# Patient Record
Sex: Male | Born: 1950
Health system: Southern US, Community
[De-identification: ages and names within clinical notes are randomized; demographics above are authoritative.]

## PROBLEM LIST (undated history)

## (undated) HISTORY — PX: CATARACT EXTRACTION: SUR2

---

## 2017-01-16 DIAGNOSIS — H02839 Dermatochalasis of unspecified eye, unspecified eyelid: Secondary | ICD-10-CM | POA: Diagnosis not present

## 2017-01-16 DIAGNOSIS — H2511 Age-related nuclear cataract, right eye: Secondary | ICD-10-CM | POA: Diagnosis not present

## 2017-01-16 DIAGNOSIS — H18411 Arcus senilis, right eye: Secondary | ICD-10-CM | POA: Diagnosis not present

## 2017-01-16 DIAGNOSIS — H25011 Cortical age-related cataract, right eye: Secondary | ICD-10-CM | POA: Diagnosis not present

## 2017-01-16 DIAGNOSIS — H401131 Primary open-angle glaucoma, bilateral, mild stage: Secondary | ICD-10-CM | POA: Diagnosis not present

## 2017-01-16 DIAGNOSIS — H2513 Age-related nuclear cataract, bilateral: Secondary | ICD-10-CM | POA: Diagnosis not present

## 2017-02-02 DIAGNOSIS — H2511 Age-related nuclear cataract, right eye: Secondary | ICD-10-CM | POA: Insufficient documentation

## 2017-02-05 DIAGNOSIS — Z79899 Other long term (current) drug therapy: Secondary | ICD-10-CM | POA: Diagnosis not present

## 2017-02-05 DIAGNOSIS — H2511 Age-related nuclear cataract, right eye: Secondary | ICD-10-CM | POA: Diagnosis not present

## 2017-02-05 DIAGNOSIS — F17200 Nicotine dependence, unspecified, uncomplicated: Secondary | ICD-10-CM | POA: Diagnosis not present

## 2017-02-05 DIAGNOSIS — Z7982 Long term (current) use of aspirin: Secondary | ICD-10-CM | POA: Diagnosis not present

## 2017-08-11 ENCOUNTER — Emergency Department (HOSPITAL_BASED_OUTPATIENT_CLINIC_OR_DEPARTMENT_OTHER)
Admission: EM | Admit: 2017-08-11 | Discharge: 2017-08-11 | Disposition: A | Discharge status: Home / Self Care | Payer: Medicare Other | Attending: Emergency Medicine | Admitting: Emergency Medicine

## 2017-08-11 ENCOUNTER — Emergency Department (HOSPITAL_BASED_OUTPATIENT_CLINIC_OR_DEPARTMENT_OTHER): Payer: Medicare Other

## 2017-08-11 ENCOUNTER — Encounter (HOSPITAL_BASED_OUTPATIENT_CLINIC_OR_DEPARTMENT_OTHER): Payer: Self-pay | Admitting: Emergency Medicine

## 2017-08-11 DIAGNOSIS — S42295A Other nondisplaced fracture of upper end of left humerus, initial encounter for closed fracture: Secondary | ICD-10-CM | POA: Diagnosis not present

## 2017-08-11 DIAGNOSIS — F17210 Nicotine dependence, cigarettes, uncomplicated: Secondary | ICD-10-CM | POA: Diagnosis not present

## 2017-08-11 DIAGNOSIS — Y999 Unspecified external cause status: Secondary | ICD-10-CM | POA: Diagnosis not present

## 2017-08-11 DIAGNOSIS — Y9301 Activity, walking, marching and hiking: Secondary | ICD-10-CM | POA: Diagnosis not present

## 2017-08-11 DIAGNOSIS — W01190A Fall on same level from slipping, tripping and stumbling with subsequent striking against furniture, initial encounter: Secondary | ICD-10-CM | POA: Insufficient documentation

## 2017-08-11 DIAGNOSIS — Z23 Encounter for immunization: Secondary | ICD-10-CM | POA: Diagnosis not present

## 2017-08-11 DIAGNOSIS — S50312A Abrasion of left elbow, initial encounter: Secondary | ICD-10-CM | POA: Diagnosis not present

## 2017-08-11 DIAGNOSIS — Y92003 Bedroom of unspecified non-institutional (private) residence as the place of occurrence of the external cause: Secondary | ICD-10-CM | POA: Insufficient documentation

## 2017-08-11 DIAGNOSIS — I10 Essential (primary) hypertension: Secondary | ICD-10-CM | POA: Diagnosis not present

## 2017-08-11 DIAGNOSIS — S59912A Unspecified injury of left forearm, initial encounter: Secondary | ICD-10-CM | POA: Diagnosis present

## 2017-08-11 DIAGNOSIS — S42292A Other displaced fracture of upper end of left humerus, initial encounter for closed fracture: Secondary | ICD-10-CM | POA: Diagnosis not present

## 2017-08-11 DIAGNOSIS — T148XXA Other injury of unspecified body region, initial encounter: Secondary | ICD-10-CM

## 2017-08-11 DIAGNOSIS — F17200 Nicotine dependence, unspecified, uncomplicated: Secondary | ICD-10-CM | POA: Insufficient documentation

## 2017-08-11 MED ORDER — TRAMADOL HCL 50 MG PO TABS: 50.0000 mg | ORAL_TABLET | Freq: Four times a day (QID) | ORAL | 0 refills | Status: AC | PRN

## 2017-08-11 MED ORDER — TETANUS-DIPHTH-ACELL PERTUSSIS 5-2.5-18.5 LF-MCG/0.5 IM SUSP
0.5000 mL | Freq: Once | INTRAMUSCULAR | Status: AC
  Administered 2017-08-11: 0.5 mL via INTRAMUSCULAR
  Filled 2017-08-11: qty 0.5

## 2017-08-11 MED ORDER — MELOXICAM 15 MG PO TABS
15.0000 mg | ORAL_TABLET | Freq: Every day | ORAL | 0 refills | Status: DC
Start: 2017-08-11 — End: 2017-08-29

## 2017-08-11 NOTE — ED Triage Notes (Signed)
Patient reports fall on Friday.  States injury to left arm.  Reports he continues to have pain on this arm.  Denies LOC, any other injuries.

## 2017-08-11 NOTE — Discharge Instructions (Signed)
Contact a health care provider if: °You have any new pain, swelling, or bruising. °Your pain, swelling, and bruising do not improve. °Your cast, splint, or sling becomes loose or damaged. °Get help right away if: °Your skin or fingers on your injured arm turn blue or gray. °Your arm feels cold or numb. °You have severe pain in your injured arm. °

## 2017-08-11 NOTE — ED Notes (Signed)
Patient transported to X-ray 

## 2017-08-11 NOTE — ED Provider Notes (Signed)
[facility]   CSN: [id number] Arrival date & time: 08/11/17  [id number]     History   Chief Complaint No chief complaint on file.   HPI Martin [name] is a 66 y.o. male who presents emergency Department with chief complaint of left arm injury. 2 days ago the patient states that he had a mechanical fall. He states he was not paying attention and hit against an antique chest in his room. His feet became entangled and he fell forward onto his left arm. He did not protect against a fall. He denies hitting his head or losing consciousness. Since that time he has had significant and severe pain in the left upper extremity. He has been unable to use the left shoulder or elbow joint. He is able to move his hand. He has significant bruising along the anterior and medial aspect of the arm and significant swelling all the way down the arm. He denies numbness or tingling. He has no previous injuries to the arm.  HPI  History reviewed. No pertinent past medical history.  There are no active problems to display for this patient.   Past Surgical History:  Procedure Laterality Date  . CATARACT EXTRACTION         Home Medications    Prior to Admission medications   Not on File    Family History History reviewed. No pertinent family history.  Social History Social History  Substance Use Topics  . Smoking status: Current Every Day Smoker    Packs/day: 0.50  . Smokeless tobacco: Never Used  . Alcohol use Yes     Comment: occ     Allergies   Patient has no known allergies.   Review of Systems Review of Systems  Ten systems reviewed and are negative for acute change, except as noted in the HPI.   Physical Exam Updated Vital Signs BP (!) 206/112   Pulse 93   Temp 98 F (36.7 C) (Oral)   Resp 18   Ht 5\' 10"  (1.778 m)   Wt 72.6 kg (160 lb)   SpO2 99%   BMI 22.96 kg/m   Physical Exam  Constitutional: He appears well-developed and well-nourished. No  distress.  HENT:  Head: Normocephalic and atraumatic.  Eyes: Conjunctivae are normal. No scleral icterus.  Neck: Normal range of motion. Neck supple.  Cardiovascular: Normal rate, regular rhythm and normal heart sounds.   Pulmonary/Chest: Effort normal and breath sounds normal. No respiratory distress.  Abdominal: Soft. There is no tenderness.  Musculoskeletal: He exhibits no edema.  Left upper extremity with deep ecchymosis in the antecubital fossa and superiorly through the course of the left bicep muscle. There is some bruising at the inferior and posterior aspect of the left deltoid. Patient is able to use the hand and wrist in its full capacity. He has pain with even minimal flexion of the bicep. He has no pain with passive range of motion of the elbow or shoulder. There is no bony tenderness. There is marked swelling all the way through the wrist. Neurovascularly intact.  Abrasion to the left elbow  Neurological: He is alert.  Skin: Skin is warm and dry. He is not diaphoretic.  Psychiatric: His behavior is normal.  Nursing note and vitals reviewed.    ED Treatments / Results  Labs (all labs ordered are listed, but only abnormal results are displayed) Labs Reviewed - No data to display  EKG  EKG Interpretation None  Radiology No results found.  Procedures Procedures (including critical care time)  Medications Ordered in ED Medications - No data to display   Initial Impression / Assessment and Plan / ED Course  I have reviewed the triage vital signs and the nursing notes.  Pertinent labs & imaging results that were available during my care of the patient were reviewed by me and considered in my medical decision making (see chart for details).     Patient with proximal left humerus fracture. Placed in a sling immobilizer. The humerus is well aligned. Patient is advised to follow-up with orthopedics. Pain medications given. He is neurovascularly intact.  Final  Clinical Impressions(s) / ED Diagnoses   Final diagnoses:  Other closed nondisplaced fracture of proximal end of left humerus, initial encounter  Hypertension, unspecified type  Abrasion    New Prescriptions New Prescriptions   No medications on file     Arthor Captain, PA-C 08/11/17 1728    Long, Arlyss Repress, MD 08/11/17 [phone]

## 2017-08-29 ENCOUNTER — Ambulatory Visit (INDEPENDENT_AMBULATORY_CARE_PROVIDER_SITE_OTHER): Payer: Medicare Other | Admitting: Family Medicine

## 2017-08-29 ENCOUNTER — Encounter: Payer: Self-pay | Admitting: Family Medicine

## 2017-08-29 ENCOUNTER — Ambulatory Visit (HOSPITAL_BASED_OUTPATIENT_CLINIC_OR_DEPARTMENT_OTHER)
Admission: RE | Admit: 2017-08-29 | Discharge: 2017-08-29 | Disposition: A | Discharge status: Home / Self Care | Payer: Medicare Other | Source: Ambulatory Visit | Attending: Family Medicine | Admitting: Family Medicine

## 2017-08-29 DIAGNOSIS — X58XXXA Exposure to other specified factors, initial encounter: Secondary | ICD-10-CM | POA: Diagnosis not present

## 2017-08-29 DIAGNOSIS — S4992XA Unspecified injury of left shoulder and upper arm, initial encounter: Secondary | ICD-10-CM | POA: Diagnosis not present

## 2017-08-29 DIAGNOSIS — S42202A Unspecified fracture of upper end of left humerus, initial encounter for closed fracture: Secondary | ICD-10-CM

## 2017-08-29 MED ORDER — MELOXICAM 15 MG PO TABS: 15.0000 mg | ORAL_TABLET | Freq: Every day | ORAL | 1 refills | Status: AC

## 2017-08-29 NOTE — Patient Instructions (Signed)
You have a proximal humerus fracture. Stop using the sling if possible now - keep this with you the next few days just in case it gets too sore. Tylenol  1-2 tabs three times a day as needed for pain. meloxicam  daily with food for pain and inflammation. Do motion exercises at least twice a day (arm circles, arm swings, wall walking or table slides) 3 sets of 10. Try not to lift more than 1 pound with this arm. Follow up with me in 3 weeks for reevaluation, repeat x-rays. We will consider physical therapy at that visit.

## 2017-09-02 DIAGNOSIS — S42202D Unspecified fracture of upper end of left humerus, subsequent encounter for fracture with routine healing: Secondary | ICD-10-CM | POA: Insufficient documentation

## 2017-09-02 NOTE — Progress Notes (Signed)
PCP: Patient, No Pcp Per  Subjective:   HPI: Patient is a 66 y.o. male here for left arm injury.  Patient reports on 9/1 he tripped and fell directly onto left arm. Immediate pain, difficulty moving about the shoulder on the left. Pain has improved since then and is 0/10 at rest, gets sharp if trying to move the arm though. Using a sling regularly. Is right handed. + swelling and bruising.  No past medical history on file.  Current Outpatient Prescriptions on File Prior to Visit  Medication Sig Dispense Refill  . traMADol (ULTRAM) 50 MG tablet Take 1 tablet (50 mg total) by mouth every 6 (six) hours as needed. 15 tablet 0   No current facility-administered medications on file prior to visit.     Past Surgical History:  Procedure Laterality Date  . CATARACT EXTRACTION      No Known Allergies  Social History   Social History  . Marital status: Single    Spouse name: N/A  . Number of children: N/A  . Years of education: N/A   Occupational History  . Not on file.   Social History Main Topics  . Smoking status: Current Every Day Smoker    Packs/day: 0.50  . Smokeless tobacco: Never Used  . Alcohol use Yes     Comment: occ  . Drug use: No  . Sexual activity: Not on file   Other Topics Concern  . Not on file   Social History Narrative  . No narrative on file    No family history on file.  BP (!) 162/110   Pulse 88   Ht  (1.778 m)   Review of Systems: See HPI above.     Objective:  Physical Exam:  Gen: NAD, comfortable in exam room  Left shoulder: Swelling from shoulder down to hand with mild pitting.  No other deformity.  Associated bruising. Mild TTP proximal upper arm.  No other tenderness of shoulder, arm. FROM wrist, elbow, digits.  Did not test shoulder ROM with known fracture today. Strength 5/5 with elbow flexion, extension, wrist flexion and extension, finger abduction . NV intact distally.   Right shoulder: FROM without  pain  Assessment & Plan:  1. Left proximal humerus fracture, closed.  Ordered and independently reviewed today's radiographs - stable alignment with ? interval healing.  Stop using sling now and focus on motion exercises.  Tylenol and meloxicam if needed.  Restricted to 1 pound at most lifting with this arm.  F/u when 6 weeks out to repeat x-rays and evaluation.  Plan to start PT at that visit as well.

## 2017-09-02 NOTE — Assessment & Plan Note (Signed)
Ordered and independently reviewed today's radiographs - stable alignment with ? interval healing.  Stop using sling now and focus on motion exercises.  Tylenol and meloxicam if needed.  Restricted to 1 pound at most lifting with this arm.  F/u when 6 weeks out to repeat x-rays and evaluation.  Plan to start PT at that visit as well.

## 2017-09-19 ENCOUNTER — Ambulatory Visit (HOSPITAL_BASED_OUTPATIENT_CLINIC_OR_DEPARTMENT_OTHER)
Admission: RE | Admit: 2017-09-19 | Discharge: 2017-09-19 | Disposition: A | Discharge status: Home / Self Care | Payer: Medicare Other | Source: Ambulatory Visit | Attending: Family Medicine | Admitting: Family Medicine

## 2017-09-19 ENCOUNTER — Encounter: Payer: Self-pay | Admitting: Family Medicine

## 2017-09-19 ENCOUNTER — Ambulatory Visit (INDEPENDENT_AMBULATORY_CARE_PROVIDER_SITE_OTHER): Payer: Medicare Other | Admitting: Family Medicine

## 2017-09-19 VITALS — BP 180/90 | Ht 70.0 in | Wt 156.0 lb

## 2017-09-19 DIAGNOSIS — S42202A Unspecified fracture of upper end of left humerus, initial encounter for closed fracture: Secondary | ICD-10-CM

## 2017-09-19 DIAGNOSIS — S42212A Unspecified displaced fracture of surgical neck of left humerus, initial encounter for closed fracture: Secondary | ICD-10-CM | POA: Diagnosis not present

## 2017-09-19 DIAGNOSIS — X58XXXA Exposure to other specified factors, initial encounter: Secondary | ICD-10-CM | POA: Diagnosis not present

## 2017-09-19 DIAGNOSIS — S42202D Unspecified fracture of upper end of left humerus, subsequent encounter for fracture with routine healing: Secondary | ICD-10-CM | POA: Diagnosis not present

## 2017-09-19 DIAGNOSIS — S42292A Other displaced fracture of upper end of left humerus, initial encounter for closed fracture: Secondary | ICD-10-CM | POA: Insufficient documentation

## 2017-09-19 NOTE — Patient Instructions (Signed)
You're doing well. Start physical therapy and do home exercises on days you don't go to therapy. Take tylenol, tramadol, and/or meloxicam if needed - you'll likely need to take something before you go to therapy. Follow up with me in about 1 month to 6 weeks for reevaluation.

## 2017-09-20 NOTE — Assessment & Plan Note (Signed)
Independently reviewed repeat radiographs - no change in alignment.  Some interval healing.  Clinically much improved however.  Will start physical therapy to regain motion and advance to strengthening.  Tylenol, tramadol, meloxicam if needed.  F/u in 1 month to 6 weeks.

## 2017-09-20 NOTE — Progress Notes (Signed)
PCP: Patient, No Pcp Per  Subjective:   HPI: Patient is a 66 y.o. male here for left arm injury.  9/21: Patient reports on 9/1 he tripped and fell directly onto left arm. Immediate pain, difficulty moving about the shoulder on the left. Pain has improved since then and is 0/10 at rest, gets sharp if trying to move the arm though. Using a sling regularly. Is right handed. + swelling and bruising.  10/12: Patient reports he's doing well. Rarely if turning the wrong way will feel a 7/10 sharp pain left shoulder but it doesn't linger. Not taking any medicines. No night pain. Doing some home exercises. Still with some bruising laterally.  No other skin changes. No numbness.  No past medical history on file.  Current Outpatient Prescriptions on File Prior to Visit  Medication Sig Dispense Refill  . latanoprost (XALATAN) 0.005 % ophthalmic solution     . meloxicam (MOBIC) 15 MG tablet Take 1 tablet (15 mg total) by mouth daily. 30 tablet 1  . traMADol (ULTRAM) 50 MG tablet Take 1 tablet (50 mg total) by mouth every 6 (six) hours as needed. 15 tablet 0   No current facility-administered medications on file prior to visit.     Past Surgical History:  Procedure Laterality Date  . CATARACT EXTRACTION      No Known Allergies  Social History   Social History  . Marital status: Single    Spouse name: N/A  . Number of children: N/A  . Years of education: N/A   Occupational History  . Not on file.   Social History Main Topics  . Smoking status: Current Every Day Smoker    Packs/day: 0.50  . Smokeless tobacco: Never Used  . Alcohol use Yes     Comment: occ  . Drug use: No  . Sexual activity: Not on file   Other Topics Concern  . Not on file   Social History Narrative  . No narrative on file    No family history on file.  BP (!) 180/90   Ht  (1.778 m)   Wt 156 lb (70.8 kg)   BMI 22.38 kg/m   Review of Systems: See HPI above.     Objective:   Physical Exam:  Gen: NAD, comfortable in exam room.  Left shoulder: Small bruise lateral shoulder.  Mild swelling into hand.  No other deformity. No TTP. Abduction and flexion to 90 degrees, abduction to 50 degrees. Negative Hawkins, Neers. Negative Yergasons. Strength 5-/5 with empty can and resisted internal/external rotation. NV intact distally.  Right shoulder: No swelling, ecchymoses.  No gross deformity. No TTP. FROM. Strength 5/5 with empty can and resisted internal/external rotation. NV intact distally.  Assessment & Plan:  1. Left proximal humerus fracture, closed.  Independently reviewed repeat radiographs - no change in alignment.  Some interval healing.  Clinically much improved however.  Will start physical therapy to regain motion and advance to strengthening.  Tylenol, tramadol, meloxicam if needed.  F/u in 1 month to 6 weeks.

## 2017-09-24 ENCOUNTER — Ambulatory Visit: Payer: Medicare Other | Attending: Family Medicine | Admitting: Physical Therapy

## 2017-09-24 DIAGNOSIS — R293 Abnormal posture: Secondary | ICD-10-CM

## 2017-09-24 DIAGNOSIS — M25612 Stiffness of left shoulder, not elsewhere classified: Secondary | ICD-10-CM | POA: Diagnosis not present

## 2017-09-24 DIAGNOSIS — M25512 Pain in left shoulder: Secondary | ICD-10-CM | POA: Insufficient documentation

## 2017-09-24 DIAGNOSIS — R29898 Other symptoms and signs involving the musculoskeletal system: Secondary | ICD-10-CM | POA: Insufficient documentation

## 2017-09-24 NOTE — Patient Instructions (Signed)
Cane Exercise: Flexion   Lie on back, holding cane above chest. Keeping arms as straight as possible, lower cane toward floor beyond head. Hold __5-10__ seconds. Repeat __10-15__ times. Do __2__ sessions per day.  Cane Exercise: Abduction   Hold cane with right hand over end, palm-up, with other hand palm-down. Move arm out from side and up by pushing with other arm. Hold __5-10__ seconds. Repeat _10-15___ times. Do __2__ sessions per day.  SELF ASSISTED WITH OBJECT: Shoulder External Rotation - Supine   Hold cane with both hands, keep elbow bent and next to body. Rotate arm away from body. _10-15__ reps per set, _2__ sets per day.  Scapular Retraction (Standing)   With arms at sides, pinch shoulder blades together. Repeat __15__ times per set. Do _2___ sessions per day.

## 2017-09-25 NOTE — Therapy (Signed)
[facility] [address]  [address] [place], [place], [address] Phone: [phone]   Fax:  [phone]  Physical Therapy Evaluation  Patient Details  Name: Martin [name] MRN: [id number] Date of Birth: 1951-09-06 Referring Provider: Dr. Norton Blizzard  Encounter Date: 09/24/2017      PT End of Session - 09/25/17 0806    Visit Number 1   Number of Visits 12   Date for PT Re-Evaluation 11/06/17   Authorization Type Medicare   PT Start Time 1403   PT Stop Time 1443   PT Time Calculation (min) 40 min   Activity Tolerance Patient tolerated treatment well   Behavior During Therapy [facility] for tasks assessed/performed      No past medical history on file.  Past Surgical History:  Procedure Laterality Date  . CATARACT EXTRACTION      There were no vitals filed for this visit.       Subjective Assessment - 09/24/17 1404    Subjective Patient reporting he was walking and tripped and hit arm - fractured humerus. Wore a sling for approx 2-3 weeks. Saw sports MD - discontinued sling. Patient not reporting most difficulty with reduced motion as well as swelling limiting making a fist. No pain unless making a "weird" motion.    Diagnostic tests xray - normal healing   Patient Stated Goals improve function of shoulder/arm   Currently in Pain? No/denies   Pain Score 0-No pain            [facility] PT Assessment - 09/24/17 1409      Assessment   Medical Diagnosis Closed fracture of proximal end of L humerus   Referring Provider Dr. Norton Blizzard   Onset Date/Surgical Date 08/11/17   Hand Dominance Right   Next MD Visit --  ~4 weeks   Prior Therapy no     Precautions   Precautions None     Restrictions   Weight Bearing Restrictions No     Balance Screen   Has the patient fallen in the past 6 months Yes   How many times? 1   Has the patient had a decrease in activity level because of a fear of falling?  No   Is the  patient reluctant to leave their home because of a fear of falling?  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   Leisure yard work, being outside     [employer]   Overall Cognitive Status Within Functional Limits for tasks assessed     Observation/Other Assessments   Focus on Therapeutic Outcomes (FOTO)  Shoulder: 54 (46% limited, predicted 29% limited)     Sensation   Light Touch Appears Intact     Coordination   Gross Motor Movements are Fluid and Coordinated No  due to weakness and loss of ROM     Posture/Postural Control   Posture/Postural Control Postural limitations   Postural Limitations Rounded Shoulders;Forward head     ROM / Strength   AROM / PROM / Strength AROM;PROM;Strength     AROM   AROM Assessment Site Shoulder   Right/Left Shoulder Left   Left Shoulder Flexion 101 Degrees   Left Shoulder ABduction 86 Degrees   Left Shoulder Internal Rotation --  FIR to approx L5   Left Shoulder External Rotation --  FER to approx occiput     PROM   PROM Assessment Site Shoulder   Right/Left Shoulder Left   Left Shoulder Flexion 121 Degrees   Left Shoulder ABduction 104 Degrees   Left Shoulder Internal Rotation 66 Degrees   Left Shoulder External Rotation 54 Degrees     Strength   Overall Strength Comments R UE grossly 4+/5; L UE grossly 3/5 within available motion     Palpation   Palpation comment diffusely non-tender            Objective measurements completed on examination: See above findings.          [facility]/Exercise - 09/24/17 1409      Exercises   Exercises Shoulder     Shoulder Exercises: Supine   External Rotation AAROM;Left;10 reps   Flexion AAROM;Left;10 reps   ABduction AAROM;Left;10 reps     Shoulder Exercises: Seated   Other Seated Exercises scap retraction 10 x 10  sec holds                PT Education - 09/25/17 0806    Education provided Yes   Education Details exam findings, POC, HEP   Person(s) Educated Patient   Methods Explanation;Demonstration;Handout   Comprehension Verbalized understanding;Returned demonstration          PT Short Term Goals - 09/25/17 0829      PT SHORT TERM GOAL #1   Title patient to be independent with initial HEP   Status New   Target Date 10/16/17     PT SHORT TERM GOAL #2   Title Patient to improve PROM of L shoulder flexion and abduction to >/= 150 degrees with pain no greater than 2/10   Status New   Target Date 10/16/17           PT Long Term Goals - 09/25/17 0830      PT LONG TERM GOAL #1   Title patient to be independent with advanced HEP   Status New   Target Date 11/06/17     PT LONG TERM GOAL #2   Title patient to improve L shoulder AROM: flexion and abduction to >/= 150 degrees    Status New   Target Date 11/06/17     PT LONG TERM GOAL #3   Title patient to improve L shoulder strength to >/= 4/5   Status New   Target Date 11/06/17     PT LONG TERM GOAL #4   Title patient to report ability to perform ADLs and light household tasks without limitation by pain or ROM   Status New   Target Date 11/06/17                Plan - 09/25/17 0807    Clinical Impression Statement Mr. Susanne GreenhouseBriles is a 66 y/o male presenting to OPPT today s/p L humerus fracture resulting from a fall. Patient today with limited AROM and PROM at L shoulder with pain at end ranges of all motion. PT speaking with MD regarding patient restrictions with patient currenty not to lift greater than 5# with L UE and 15# with B UE. Patient given initial HEP for AAROM exercises to promote increased ROM as well as gentle periscpaular strengthening. Patient to benefit from PT to address deficits listed above to allow for improved functional use of L UE.    Clinical Presentation Stable   Clinical Decision Making Low    Rehab Potential Good   PT Frequency 2x /  week   PT Duration 6 weeks   PT Treatment/Interventions ADLs/Self Care Home Management;Cryotherapy;Electrical Stimulation;Iontophoresis 4mg /ml Dexamethasone;Moist Heat;Ultrasound;Therapeutic exercise;Therapeutic activities;Patient/family education;Manual techniques;Passive range of motion;Vasopneumatic Device;Taping;Dry needling   Consulted and Agree with Plan of Care Patient      Patient will benefit from skilled therapeutic intervention in order to improve the following deficits and impairments:  Impaired UE functional use, Pain, Decreased strength, Decreased range of motion  Visit Diagnosis: Acute pain of left shoulder - Plan: PT plan of care cert/re-cert  Stiffness of left shoulder, not elsewhere classified - Plan: PT plan of care cert/re-cert  Other symptoms and signs involving the musculoskeletal system - Plan: PT plan of care cert/re-cert  Abnormal posture - Plan: PT plan of care cert/re-cert      G-Codes - 2017/10/17 0843    Functional Assessment Tool Used (Outpatient Only) FOTO: 54 (46% limited)   Functional Limitation Carrying, moving and handling objects   Carrying, Moving and Handling Objects Current Status (Z6109) At least 40 percent but less than 60 percent impaired, limited or restricted   Carrying, Moving and Handling Objects Goal Status (U0454) At least 20 percent but less than 40 percent impaired, limited or restricted       Problem List Patient Active Problem List   Diagnosis Date Noted  . Closed fracture of proximal end of left humerus with routine healing, subsequent encounter 09/02/2017  . Nuclear sclerotic cataract of right eye 02/02/2017    Kipp Laurence, PT, DPT 10-17-17 8:46 AM   [facility] [address]  [address] [place], [place], [address] Phone: [phone]   Fax:  [phone]  Name: Koltyn Kelsay MRN: [id number] Date of Birth:  1951/05/22

## 2017-09-30 ENCOUNTER — Ambulatory Visit: Payer: Medicare Other | Admitting: Physical Therapy

## 2017-10-02 ENCOUNTER — Ambulatory Visit: Payer: Medicare Other | Admitting: Physical Therapy

## 2017-10-02 DIAGNOSIS — R293 Abnormal posture: Secondary | ICD-10-CM

## 2017-10-02 DIAGNOSIS — M25512 Pain in left shoulder: Secondary | ICD-10-CM

## 2017-10-02 DIAGNOSIS — R29898 Other symptoms and signs involving the musculoskeletal system: Secondary | ICD-10-CM | POA: Diagnosis not present

## 2017-10-02 DIAGNOSIS — M25612 Stiffness of left shoulder, not elsewhere classified: Secondary | ICD-10-CM | POA: Diagnosis not present

## 2017-10-02 NOTE — Therapy (Signed)
[facility] [address]  [address] [place], [place], [address] Phone: [phone]   Fax:  [phone]  Physical Therapy Treatment  Patient Details  Name: Martin NapWilliam Pellecchia [name] MRN: [id number] Date of Birth: Aug 09, 1951 Referring Provider: Dr. Norton BlizzardShane Hudnall  Encounter Date: 10/02/2017      PT End of Session - 10/02/17 1546    Visit Number 2   Number of Visits 12   Date for PT Re-Evaluation 11/06/17   Authorization Type Medicare   PT Start Time 1531   PT Stop Time 1619   PT Time Calculation (min) 48 min   Activity Tolerance Patient tolerated treatment well   Behavior During Therapy [facility] for tasks assessed/performed      No past medical history on file.  Past Surgical History:  Procedure Laterality Date  . CATARACT EXTRACTION      There were no vitals filed for this visit.      Subjective Assessment - 10/02/17 1546    Subjective has been doing HEP; noticing some improvements   Diagnostic tests xray - normal healing   Patient Stated Goals improve function of shoulder/arm   Currently in Pain? No/denies   Pain Score --  8/10 with PROM                         [facility] Adult PT Treatment/Exercise - 10/02/17 0001      Shoulder Exercises: Seated   Flexion AAROM;Left;10 reps   Flexion Limitations use of mirror to reduce compensation   Abduction AAROM;Left;10 reps   ABduction Limitations use of mirror to reduce compensation     Shoulder Exercises: Standing   Other Standing Exercises counter walkouts for flexion ROM x 5 reps     Shoulder Exercises: Pulleys   Flexion 3 minutes   ABduction 3 minutes   ABduction Limitations scaption     Shoulder Exercises: Isometric Strengthening   Flexion Limitations 10 x 5 sec; 50% effort, L UE   External Rotation Limitations 10 x 5 sec; 50% effort, L UE   Internal Rotation Limitations 10 x 5 sec; 50% effort, L UE   ABduction Limitations 10 x 5 sec; 50% effort, L UE    ADduction Limitations 10 x 5 sec; 50% effort, L UE     Manual Therapy   Manual Therapy Passive ROM   Manual therapy comments patient supine with bolster   Passive ROM PROM at L [facility] into all planes; pain at end ranges of all motion limiting continued motion; ER/IR/abduction performed in slight scaption                  PT Short Term Goals - 10/02/17 1547      PT SHORT TERM GOAL #1   Title patient to be independent with initial HEP   Status On-going     PT SHORT TERM GOAL #2   Title Patient to improve PROM of L shoulder flexion and abduction to >/= 150 degrees with pain no greater than 2/10   Status On-going           PT Long Term Goals - 10/02/17 1812      PT LONG TERM GOAL #1   Title patient to be independent with advanced HEP   Status On-going     PT LONG TERM GOAL #2   Title patient to improve L shoulder AROM: flexion and abduction to >/= 150 degrees    Status On-going  PT LONG TERM GOAL #3   Title patient to improve L shoulder strength to >/= 4/5   Status On-going     PT LONG TERM GOAL #4   Title patient to report ability to perform ADLs and light household tasks without limitation by pain or ROM   Status On-going               Plan - 10/02/17 1813    Clinical Impression Statement Patient doing well today - reports taking pain medication prior to visit. GOod compliance with HEP thus far. Paitent tolerating all PROM at L [facility] joint into further ranges, however pain at end ranges of all motion limiting, rather than end-feel. Patient initiating isometrics today with good tolerance and muscle activation.    PT Treatment/Interventions ADLs/Self Care Home Management;Cryotherapy;Electrical Stimulation;Iontophoresis 4mg /ml Dexamethasone;Moist Heat;Ultrasound;Therapeutic exercise;Therapeutic activities;Patient/family education;Manual techniques;Passive range of motion;Vasopneumatic Device;Taping;Dry needling   Consulted and Agree with Plan of Care  Patient      Patient will benefit from skilled therapeutic intervention in order to improve the following deficits and impairments:  Impaired UE functional use, Pain, Decreased strength, Decreased range of motion  Visit Diagnosis: Acute pain of left shoulder  Stiffness of left shoulder, not elsewhere classified  Other symptoms and signs involving the musculoskeletal system  Abnormal posture     Problem List Patient Active Problem List   Diagnosis Date Noted  . Closed fracture of proximal end of left humerus with routine healing, subsequent encounter 09/02/2017  . Nuclear sclerotic cataract of right eye 02/02/2017     Kipp Laurence, PT, DPT 10/02/17 6:15 PM   [facility] [address]  [address] [place], [place], [address] Phone: [phone]   Fax:  [phone]  Name: Martin [name] MRN: [id number] Date of Birth: [birth year]

## 2017-10-02 NOTE — Patient Instructions (Signed)
Flexion (Isometric)    Press right fist against wall. Hold __5-10__ seconds. Repeat _10-15___ times.    Internal Rotation (Isometric)    Place palm of right fist against door frame, with elbow bent. Press fist against door frame. Hold __5-10__ seconds. Repeat __10-15__ times.   External Rotation (Isometric)    Place back of left fist against door frame, with elbow bent. Press fist against door frame. Hold _5-10___ seconds. Repeat __10-15__ times.   Strengthening: Isometric Adduction    Using body for resistance, gently press right arm into ball using light pressure. Hold _5-10___ seconds. Repeat _10-15___ times per set.   Strengthening: Isometric Abduction    Using wall for resistance, press left arm into ball using light pressure. Hold __5-10__ seconds. Repeat _10-15___ times per set.

## 2017-10-07 ENCOUNTER — Ambulatory Visit: Payer: Medicare Other

## 2017-10-07 DIAGNOSIS — R293 Abnormal posture: Secondary | ICD-10-CM

## 2017-10-07 DIAGNOSIS — M25512 Pain in left shoulder: Secondary | ICD-10-CM

## 2017-10-07 DIAGNOSIS — M25612 Stiffness of left shoulder, not elsewhere classified: Secondary | ICD-10-CM | POA: Diagnosis not present

## 2017-10-07 DIAGNOSIS — R29898 Other symptoms and signs involving the musculoskeletal system: Secondary | ICD-10-CM | POA: Diagnosis not present

## 2017-10-07 NOTE — Therapy (Signed)
[facility] [address]  [address] [place], [place], [address] Phone: [phone]   Fax:  [phone]  Physical Therapy Treatment  Patient Details  Name: Martin [name] MRN: [id number] Date of Birth: 1951-01-14 Referring Provider: Dr. Norton Blizzard  Encounter Date: 10/07/2017      PT End of Session - 10/07/17 1319    Visit Number 3   Number of Visits 12   Date for PT Re-Evaluation 11/06/17   Authorization Type Medicare   PT Start Time 1312   PT Stop Time 1403   PT Time Calculation (min) 51 min   Activity Tolerance Patient tolerated treatment well   Behavior During Therapy [facility] for tasks assessed/performed      No past medical history on file.  Past Surgical History:  Procedure Laterality Date  . CATARACT EXTRACTION      There were no vitals filed for this visit.      Subjective Assessment - 10/07/17 1315    Subjective Pt. reporting latest HEP going well.     Patient Stated Goals improve function of shoulder/arm   Currently in Pain? No/denies   Pain Score 0-No pain   Multiple Pain Sites No                         [facility] Adult PT Treatment/Exercise - 10/07/17 1338      Self-Care   Self-Care Other Self-Care Comments   Other Self-Care Comments  Self-massage with red med ball (1000Gr) on wall to posterior/inferior shoulder in L posterior shoulder stretch position x 1 min      Shoulder Exercises: Seated   Flexion AAROM;Left;10 reps   Flexion Limitations use of mirror to reduce compensation   Abduction AAROM;Left;10 reps  scaption   ABduction Limitations use of mirror to reduce compensation   Other Seated Exercises flexion and scaption red P-ball roll outs sitting on mat table x 10 reps each way   Other Seated Exercises L shoulder scaption slide on counter (pt. sitting in chair) x 10 reps   hand on wash cloth     Shoulder Exercises: Sidelying   External Rotation Left;10 reps;AROM   External Rotation Limitations towel under elbow      Shoulder Exercises: Pulleys   Flexion 3 minutes   ABduction 3 minutes   ABduction Limitations scaption     Shoulder Exercises: Stretch   Other Shoulder Stretches L posterior shoulder stretch x 30 sec      Manual Therapy   Manual Therapy Passive ROM;Soft tissue mobilization;Muscle Energy Technique   Manual therapy comments patient supine with bolster   Soft tissue mobilization STM/DTM to posterior/inferior shoulder in 90 dg scaption position    Passive ROM PROM at L [address] joint into all planes; pain at end ranges of all motion limiting continued motion; ER/IR/abduction performed in slight scaption   Muscle Energy Technique L shoulder contract/relax stretch into scaption and flexion motions; good tolerance for this with reduced guarding                   PT Short Term Goals - 10/02/17 1547      PT SHORT TERM GOAL #1   Title patient to be independent with initial HEP   Status On-going     PT SHORT TERM GOAL #2   Title Patient to improve PROM of L shoulder flexion and abduction to >/= 150 degrees with pain no greater than 2/10   Status  On-going           PT Long Term Goals - 10/02/17 1812      PT LONG TERM GOAL #1   Title patient to be independent with advanced HEP   Status On-going     PT LONG TERM GOAL #2   Title patient to improve L shoulder AROM: flexion and abduction to >/= 150 degrees    Status On-going     PT LONG TERM GOAL #3   Title patient to improve L shoulder strength to >/= 4/5   Status On-going     PT LONG TERM GOAL #4   Title patient to report ability to perform ADLs and light household tasks without limitation by pain or ROM   Status On-going               Plan - 10/07/17 1330    Clinical Impression Statement Martin [name] reporting latest HEP going well.  Felt good following last visit.  Good tolerance today for shoulder PROM with prolonged holds.  Much reduced muscular guarding with  contract/relax into flexion and scaption movements today.  Some palpable tightness in posterior/inferior shoulder with flexion and scaption motions thus some STM/DTM to this area with good tolerance.  Self-ball massage to this area initiated today with pt. tolerating well.  Will continue to progress per pt. in coming visits.   PT Treatment/Interventions ADLs/Self Care Home Management;Cryotherapy;Electrical Stimulation;Iontophoresis 4mg /ml Dexamethasone;Moist Heat;Ultrasound;Therapeutic exercise;Therapeutic activities;Patient/family education;Manual techniques;Passive range of motion;Vasopneumatic Device;Taping;Dry needling      Patient will benefit from skilled therapeutic intervention in order to improve the following deficits and impairments:  Impaired UE functional use, Pain, Decreased strength, Decreased range of motion  Visit Diagnosis: Acute pain of left shoulder  Stiffness of left shoulder, not elsewhere classified  Other symptoms and signs involving the musculoskeletal system  Abnormal posture     Problem List Patient Active Problem List   Diagnosis Date Noted  . Closed fracture of proximal end of left humerus with routine healing, subsequent encounter 09/02/2017  . Nuclear sclerotic cataract of right eye 02/02/2017    Kermit BaloMicah Raeya Merritts, PTA 10/07/17 2:25 PM  [facility] [place] [address]  [address] [place], [place], [address] Phone: [phone]   Fax:  [phone]  Name: Martin [name] MRN: [id number] Date of Birth: 04/15/1951

## 2017-10-09 ENCOUNTER — Ambulatory Visit: Payer: Medicare Other | Attending: Family Medicine | Admitting: Physical Therapy

## 2017-10-09 DIAGNOSIS — R293 Abnormal posture: Secondary | ICD-10-CM | POA: Insufficient documentation

## 2017-10-09 DIAGNOSIS — M25612 Stiffness of left shoulder, not elsewhere classified: Secondary | ICD-10-CM | POA: Insufficient documentation

## 2017-10-09 DIAGNOSIS — R29898 Other symptoms and signs involving the musculoskeletal system: Secondary | ICD-10-CM | POA: Diagnosis not present

## 2017-10-09 DIAGNOSIS — M25512 Pain in left shoulder: Secondary | ICD-10-CM | POA: Insufficient documentation

## 2017-10-09 NOTE — Patient Instructions (Signed)
External / Internal Rotator Cuff Stretch, Standing    Stand and reach one arm over head, other arm behind back. Clasp hands, if possible. Use belt or small towel if hands cannot clasp. Hold _30__ seconds. Repeat _3__ times per session.

## 2017-10-09 NOTE — Therapy (Signed)
[facility] [address]  [address] [place], [place], [address] Phone: [phone]   Fax:  [phone]  Physical Therapy Treatment  Patient Details  Name: Martin [name] MRN: [id number] Date of Birth: [birth year] Referring Provider: Dr. Norton BlizzardShane Hudnall  Encounter Date: 10/09/2017      PT End of Session - 10/09/17 1501    Visit Number 4   Number of Visits 12   Date for PT Re-Evaluation 11/06/17   Authorization Type Medicare   PT Start Time 1400   PT Stop Time 1448   PT Time Calculation (min) 48 min   Activity Tolerance Patient tolerated treatment well   Behavior During Therapy [facility] for tasks assessed/performed      No past medical history on file.  Past Surgical History:  Procedure Laterality Date  . CATARACT EXTRACTION      There were no vitals filed for this visit.      Subjective Assessment - 10/09/17 1359    Subjective Patient doing well today. Reporting no pain and feels as though he is gaining more and more motion every day.    Currently in Pain? No/denies   Pain Score 0-No pain                         [facility] Adult PT Treatment/Exercise - 10/09/17 1401      Shoulder Exercises: Seated   Other Seated Exercises 3 way red p-ball roll outs; 10 reps each way     Shoulder Exercises: Sidelying   External Rotation Left;10 reps;AROM   External Rotation Limitations towel under elbow    Internal Rotation AROM;Left;10 reps   Theraband Level (Shoulder Internal Rotation) Level 1 (Yellow)   Internal Rotation Limitations towel under elbow     Shoulder Exercises: Standing   Flexion AROM;Left;10 reps   Flexion Limitations touch shelf 1 and 2 in cabinet   ABduction AROM;10 reps;Left   ABduction Limitations touch shelf 1 and 2 in cabinet   Other Standing Exercises push up plus at counter; 15 reps     Shoulder Exercises: Pulleys   Flexion 3 minutes   ABduction 3 minutes   ABduction Limitations  scaption     Manual Therapy   Manual Therapy Passive ROM   Manual therapy comments patient supine with bolster   Passive ROM PROM at L [facility] into all planes; pain at end ranges of all motion limiting continued motion; ER/IR/abduction performed in slight scaption                  PT Short Term Goals - 10/02/17 1547      PT SHORT TERM GOAL #1   Title patient to be independent with initial HEP   Status On-going     PT SHORT TERM GOAL #2   Title Patient to improve PROM of L shoulder flexion and abduction to >/= 150 degrees with pain no greater than 2/10   Status On-going           PT Long Term Goals - 10/02/17 1812      PT LONG TERM GOAL #1   Title patient to be independent with advanced HEP   Status On-going     PT LONG TERM GOAL #2   Title patient to improve L shoulder AROM: flexion and abduction to >/= 150 degrees    Status On-going     PT LONG TERM GOAL #3   Title patient to improve  L shoulder strength to >/= 4/5   Status On-going     PT LONG TERM GOAL #4   Title patient to report ability to perform ADLs and light household tasks without limitation by pain or ROM   Status On-going               Plan - 10/09/17 1503    Clinical Impression Statement Patient reports doing well with all HEP, little to no complaints of pain since his last visit. Patient progressing will with PROM and prolonged stretching and able to tolerate slightly more pressure in all planes. Focused today's session on more AROM in all planes with minimal cueing for patient to maintain proper posture and avoid muscle guarding for all motions at LUE.    PT Treatment/Interventions ADLs/Self Care Home Management;Cryotherapy;Electrical Stimulation;Iontophoresis 4mg /ml Dexamethasone;Moist Heat;Ultrasound;Therapeutic exercise;Therapeutic activities;Patient/family education;Manual techniques;Passive range of motion;Vasopneumatic Device;Taping;Dry needling      Patient will benefit from  skilled therapeutic intervention in order to improve the following deficits and impairments:  Impaired UE functional use, Pain, Decreased strength, Decreased range of motion  Visit Diagnosis: Acute pain of left shoulder  Stiffness of left shoulder, not elsewhere classified  Other symptoms and signs involving the musculoskeletal system  Abnormal posture     Problem List Patient Active Problem List   Diagnosis Date Noted  . Closed fracture of proximal end of left humerus with routine healing, subsequent encounter 09/02/2017  . Nuclear sclerotic cataract of right eye 02/02/2017     Emerson Monte, SPT 10/09/17 3:08 PM    [facility] [address]  [address] [place], [place], [address] Phone: [phone]   Fax:  [phone]  Name: Martin [name] MRN: [id number] Date of Birth: 1951-01-01

## 2017-10-14 ENCOUNTER — Ambulatory Visit: Payer: Medicare Other

## 2017-10-14 DIAGNOSIS — M25512 Pain in left shoulder: Secondary | ICD-10-CM

## 2017-10-14 DIAGNOSIS — R293 Abnormal posture: Secondary | ICD-10-CM

## 2017-10-14 DIAGNOSIS — R29898 Other symptoms and signs involving the musculoskeletal system: Secondary | ICD-10-CM | POA: Diagnosis not present

## 2017-10-14 DIAGNOSIS — M25612 Stiffness of left shoulder, not elsewhere classified: Secondary | ICD-10-CM

## 2017-10-14 NOTE — Therapy (Signed)
[facility] [address]  [address] [place], [place], [address] Phone: [phone]   Fax:  [phone]  Physical Therapy Treatment  Patient Details  Name: Martin [name] MRN: [id number] Date of Birth: January 11, 1951 Referring Provider: Dr. Norton BlizzardShane Hudnall    Encounter Date: 10/14/2017  PT End of Session - 10/14/17 1316    Visit Number  5    Number of Visits  12    Date for PT Re-Evaluation  11/06/17    Authorization Type  Medicare    PT Start Time  1311    PT Stop Time  1400    PT Time Calculation (min)  49 min    Activity Tolerance  Patient tolerated treatment well    Behavior During Therapy  [facility] for tasks assessed/performed       No past medical history on file.  Past Surgical History:  Procedure Laterality Date  . CATARACT EXTRACTION      There were no vitals filed for this visit.  Subjective Assessment - 10/14/17 1314    Subjective  Pt. reporting he feels weather change has caused some pain in shoulder today.      Patient Stated Goals  improve function of shoulder/arm    Currently in Pain?  Yes    Pain Score  1  only has this pain when reaching the wrong way   only has this pain when reaching the wrong way   Pain Location  Shoulder    Pain Orientation  Left    Pain Descriptors / Indicators  Aching    Aggravating Factors   When I turn the wrong way    Multiple Pain Sites  No         [facility] - 10/14/17 1403      Assessment   Referring Provider  Dr. Norton BlizzardShane Hudnall     Onset Date/Surgical Date  08/11/17    Hand Dominance  Right    Next MD Visit  11.16.18    Prior Therapy  no                  [facility] Adult PT Treatment/Exercise - 10/14/17 1329      Shoulder Exercises: Standing   External Rotation  Left;10 reps;Theraband    Theraband Level (Shoulder External Rotation)  Level 1 (Yellow)    Internal Rotation  Left;10 reps;Theraband    Theraband Level (Shoulder Internal Rotation)   Level 1 (Yellow)    Row  Both;10 reps;Theraband cues for scapular retraction    cues for scapular retraction    Theraband Level (Shoulder Row)  Level 1 (Yellow)      Shoulder Exercises: Pulleys   Flexion  3 minutes    ABduction  3 minutes    ABduction Limitations  scaption      Shoulder Exercises: ROM/Strengthening   Pushups  15 reps    Pushups Limitations  Standing pushup plus on peanut p-ball on mat table x 15 reps       Shoulder Exercises: Stretch   Internal Rotation Stretch  20 seconds    Other Shoulder Stretches  L posterior shoulder stretch x 30 sec     Internal Rotation Stretch Limitations  2 x 20 sec with towel       Manual Therapy   Manual Therapy  Passive ROM;Scapular mobilization    Manual therapy comments  patient supine with bolster    Scapular Mobilization  L scapular mobs all directions; somewhat limited mobility  Passive ROM  PROM at L [facility] into all planes; pain at end ranges of all motion limiting continued motion; ER/IR/abduction performed in slight scaption    Muscle Energy Technique  L shoulder contract/relax stretch into scaption and flexion motions; good tolerance for this with reduced guarding              PT Education - 10/14/17 1406    Education provided  Yes    Education Details  IR, ER, row with band in door and issued to pt.     Person(s) Educated  Patient    Methods  Explanation;Demonstration;Verbal cues;Handout    Comprehension  Verbalized understanding;Returned demonstration;Verbal cues required;Need further instruction       PT Short Term Goals - 10/02/17 1547      PT SHORT TERM GOAL #1   Title  patient to be independent with initial HEP    Status  On-going      PT SHORT TERM GOAL #2   Title  Patient to improve PROM of L shoulder flexion and abduction to >/= 150 degrees with pain no greater than 2/10    Status  On-going        PT Long Term Goals - 10/02/17 1812      PT LONG TERM GOAL #1   Title  patient to be  independent with advanced HEP    Status  On-going      PT LONG TERM GOAL #2   Title  patient to improve L shoulder AROM: flexion and abduction to >/= 150 degrees     Status  On-going      PT LONG TERM GOAL #3   Title  patient to improve L shoulder strength to >/= 4/5    Status  On-going      PT LONG TERM GOAL #4   Title  patient to report ability to perform ADLs and light household tasks without limitation by pain or ROM    Status  On-going            Plan - 10/14/17 1317    Clinical Impression Statement  Pt. reports discomfort in shoulder today attributing this to weather change.  Tolerated yellow TB resisted shoulder IR, ER, and row well with good overall technique thus these activities added to HEP.  Mirror feedback reducing pt. tendency for "shoulder hike" with AROM flexion with Bill able to self-correct this substitution.   Continued focus on contract/relax stretching and scapular mobs for improved ROM today with pt. still with tendency to guard with PROM.  Bill progressing well at this point declining ice to end treatment and pain free.   Will monitor response to updated at upcoming visit.    PT Treatment/Interventions  ADLs/Self Care Home Management;Cryotherapy;Electrical Stimulation;Iontophoresis 4mg /ml Dexamethasone;Moist Heat;Ultrasound;Therapeutic exercise;Therapeutic activities;Patient/family education;Manual techniques;Passive range of motion;Vasopneumatic Device;Taping;Dry needling       Patient will benefit from skilled therapeutic intervention in order to improve the following deficits and impairments:  Impaired UE functional use, Pain, Decreased strength, Decreased range of motion  Visit Diagnosis: Acute pain of left shoulder  Stiffness of left shoulder, not elsewhere classified  Other symptoms and signs involving the musculoskeletal system  Abnormal posture     Problem List Patient Active Problem List   Diagnosis Date Noted  . Closed fracture of proximal  end of left humerus with routine healing, subsequent encounter 09/02/2017  . Nuclear sclerotic cataract of right eye 02/02/2017    Kermit BaloMicah Zamere Pasternak, PTA 10/14/17 2:18 PM  [facility] [address]  [address] [place], [place], [address] Phone: [phone]   Fax:  [phone]  Name: Martin [name] MRN: [id number] Date of Birth: [birth year]

## 2017-10-16 ENCOUNTER — Ambulatory Visit: Payer: Medicare Other | Admitting: Physical Therapy

## 2017-10-16 ENCOUNTER — Encounter: Payer: Self-pay | Admitting: Physical Therapy

## 2017-10-16 DIAGNOSIS — M25512 Pain in left shoulder: Secondary | ICD-10-CM

## 2017-10-16 DIAGNOSIS — R293 Abnormal posture: Secondary | ICD-10-CM

## 2017-10-16 DIAGNOSIS — R29898 Other symptoms and signs involving the musculoskeletal system: Secondary | ICD-10-CM

## 2017-10-16 DIAGNOSIS — M25612 Stiffness of left shoulder, not elsewhere classified: Secondary | ICD-10-CM | POA: Diagnosis not present

## 2017-10-16 NOTE — Therapy (Signed)
[facility] [place] [address]  [address] [place], [place], [address] Phone: [phone]   Fax:  [phone]  Physical Therapy Treatment  Patient Details  Name: Martin [name] MRN: [id number] Date of Birth: 1951/04/05 Referring Provider: Dr. Norton BlizzardShane Hudnall    Encounter Date: 10/16/2017  PT End of Session - 10/16/17 1446    Visit Number  6    Number of Visits  12    Date for PT Re-Evaluation  11/06/17    Authorization Type  Medicare    PT Start Time  1359    PT Stop Time  1445    PT Time Calculation (min)  46 min    Activity Tolerance  Patient tolerated treatment well    Behavior During Therapy  [facility]WFL for tasks assessed/performed       History reviewed. No pertinent past medical history.  Past Surgical History:  Procedure Laterality Date  . CATARACT EXTRACTION      There were no vitals filed for this visit.  Subjective Assessment - 10/16/17 1400    Subjective  Patient reports feeling well today with no pain and able to complete HEP with good compliance. Reporting difficulty with turning the steering wheel with L UE, but otherwise no complaints.     Currently in Pain?  No/denies                      [facility] - 10/16/17 1403      Shoulder Exercises: Seated   Other Seated Exercises  1# on wrist; table at shoulder height; move bean bags over cone from one side to other and back      Shoulder Exercises: Prone   Other Prone Exercises  quadruped hand step ups on foam pad; 10 lateral; 10 forward      Shoulder Exercises: Standing   External Rotation  Left;15 reps;Theraband    Theraband Level (Shoulder External Rotation)  Level 1 (Yellow)    Internal Rotation  Left;15 reps;Theraband    Theraband Level (Shoulder Internal Rotation)  Level 1 (Yellow)    Flexion  Left;10 reps;Weights    Shoulder Flexion Weight (lbs)  2#    Flexion Limitations  cabinet reach to 2nd shelf    ABduction   Left;10 reps;Weights    Shoulder ABduction Weight (lbs)  2#    ABduction Limitations  cabinet reach to 2nd shelf      Shoulder Exercises: ROM/Strengthening   UBE (Upper Arm Bike)  L1; 2 min. forward/2 min. backwards      Manual Therapy   Manual Therapy  Passive ROM;Taping    Manual therapy comments  patient supine with bolster    Passive ROM  PROM at L [facility]GH joint into all planes; pain at end ranges of all motion limiting continued motion; ER/IR/abduction performed in slight scaption    Kinesiotex  Inhibit Muscle      Kinesiotix   Inhibit Muscle   single "I" strip to L bicep               PT Short Term Goals - 10/02/17 1547      PT SHORT TERM GOAL #1   Title  patient to be independent with initial HEP    Status  On-going      PT SHORT TERM GOAL #2   Title  Patient to improve PROM of L shoulder flexion and abduction to >/= 150 degrees with pain no greater than 2/10  Status  On-going        PT Long Term Goals - 10/02/17 1812      PT LONG TERM GOAL #1   Title  patient to be independent with advanced HEP    Status  On-going      PT LONG TERM GOAL #2   Title  patient to improve L shoulder AROM: flexion and abduction to >/= 150 degrees     Status  On-going      PT LONG TERM GOAL #3   Title  patient to improve L shoulder strength to >/= 4/5    Status  On-going      PT LONG TERM GOAL #4   Title  patient to report ability to perform ADLs and light household tasks without limitation by pain or ROM    Status  On-going            Plan - 10/16/17 1447    Clinical Impression Statement  Patient progressing well with AROM and mild resistance exercises. Infrequent reports of pain at rest, however continues to have pain at end range with PROM in all planes. Continued need for cueing to decrease patient compensation with shoulder motion past 90 deg. flexion or abduciton. Taped patient bicep at end of session today for pain control. Will progress as patient tolerates,     PT Treatment/Interventions  ADLs/Self Care Home Management;Cryotherapy;Electrical Stimulation;Iontophoresis 4mg /ml Dexamethasone;Moist Heat;Ultrasound;Therapeutic exercise;Therapeutic activities;Patient/family education;Manual techniques;Passive range of motion;Vasopneumatic Device;Taping;Dry needling    PT Next Visit Plan  note to MD       Patient will benefit from skilled therapeutic intervention in order to improve the following deficits and impairments:  Impaired UE functional use, Pain, Decreased strength, Decreased range of motion  Visit Diagnosis: Acute pain of left shoulder  Stiffness of left shoulder, not elsewhere classified  Other symptoms and signs involving the musculoskeletal system  Abnormal posture     Problem List Patient Active Problem List   Diagnosis Date Noted  . Closed fracture of proximal end of left humerus with routine healing, subsequent encounter 09/02/2017  . Nuclear sclerotic cataract of right eye 02/02/2017     Emerson MonteKimberly Sterling Mondo, SPT 10/16/17 2:59 PM   [facility] [place] [address]  [address] [place], [place], [address] Phone: [phone]   Fax:  [phone]  Name: Martin [name] MRN: [id number] Date of Birth: 1951-07-13

## 2017-10-21 ENCOUNTER — Ambulatory Visit: Payer: Medicare Other | Admitting: Physical Therapy

## 2017-10-21 ENCOUNTER — Encounter: Payer: Self-pay | Admitting: Physical Therapy

## 2017-10-21 DIAGNOSIS — R293 Abnormal posture: Secondary | ICD-10-CM

## 2017-10-21 DIAGNOSIS — M25512 Pain in left shoulder: Secondary | ICD-10-CM | POA: Diagnosis not present

## 2017-10-21 DIAGNOSIS — M25612 Stiffness of left shoulder, not elsewhere classified: Secondary | ICD-10-CM

## 2017-10-21 DIAGNOSIS — R29898 Other symptoms and signs involving the musculoskeletal system: Secondary | ICD-10-CM | POA: Diagnosis not present

## 2017-10-21 NOTE — Therapy (Signed)
[facility] [place] [address]  [address] [place], [place], [address] Phone: [phone]   Fax:  [phone]  Physical Therapy Treatment  Patient Details  Name: Martin [name] MRN: [id number] Date of Birth: 08-Oct-1951 Referring Provider: Dr. Norton BlizzardShane Hudnall    Encounter Date: 10/21/2017  PT End of Session - 10/21/17 1309    Visit Number  7    Number of Visits  12    Date for PT Re-Evaluation  11/06/17    Authorization Type  Medicare    PT Start Time  1308    PT Stop Time  1356    PT Time Calculation (min)  48 min    Activity Tolerance  Patient tolerated treatment well    Behavior During Therapy  [facility] HospitalWFL for tasks assessed/performed       History reviewed. No pertinent past medical history.  Past Surgical History:  Procedure Laterality Date  . CATARACT EXTRACTION      There were no vitals filed for this visit.  Subjective Assessment - 10/21/17 1309    Subjective  sees MD for follow up on Friday    Diagnostic tests  xray - normal healing    Patient Stated Goals  improve function of shoulder/arm    Currently in Pain?  No/denies    Pain Score  0-No pain                      [facility] Adult PT Treatment/Exercise - 10/21/17 1311      Shoulder Exercises: Supine   Horizontal ABduction  Strengthening;Both;15 reps;Theraband    Theraband Level (Shoulder Horizontal ABduction)  Level 1 (Yellow)    Horizontal ABduction Limitations  hooklying on 1/2 fom roll    External Rotation  Strengthening;Both;15 reps;Theraband    Theraband Level (Shoulder External Rotation)  Level 1 (Yellow)    External Rotation Limitations  hooklying on 1/2 fom roll    Other Supine Exercises  serratus punch - 3# B UE x 15 reps      Shoulder Exercises: Standing   Other Standing Exercises  tricep pulldowns x 15 - red tband    Other Standing Exercises  bicep curls - thumb up x 15, palm up x 15 - red tband      Shoulder Exercises:  ROM/Strengthening   UBE (Upper Arm Bike)  L2.5 x 6 min (3/3)    Wall Pushups  15 reps    Wall Pushups Limitations  with plus    Ball on Wall  walking orange pball up wall - 2# at wrist x 15 reps - some UT compensation    Other ROM/Strengthening Exercises  wall ladder - flexion with hold at top and eccentric lowering x 10 reps      Manual Therapy   Manual Therapy  Soft tissue mobilization;Passive ROM    Manual therapy comments  patient supine with bolster    Soft tissue mobilization  STM to L shoulder complex - some TTP along L UT    Passive ROM  PROM at L [facility]GH joint into all planes; pain at end ranges of all motion limiting continued motion; ER/IR/abduction performed in slight scaption               PT Short Term Goals - 10/02/17 1547      PT SHORT TERM GOAL #1   Title  patient to be independent with initial HEP    Status  On-going      PT  SHORT TERM GOAL #2   Title  Patient to improve PROM of L shoulder flexion and abduction to >/= 150 degrees with pain no greater than 2/10    Status  On-going        PT Long Term Goals - 10/02/17 1812      PT LONG TERM GOAL #1   Title  patient to be independent with advanced HEP    Status  On-going      PT LONG TERM GOAL #2   Title  patient to improve L shoulder AROM: flexion and abduction to >/= 150 degrees     Status  On-going      PT LONG TERM GOAL #3   Title  patient to improve L shoulder strength to >/= 4/5    Status  On-going      PT LONG TERM GOAL #4   Title  patient to report ability to perform ADLs and light household tasks without limitation by pain or ROM    Status  On-going            Plan - 10/21/17 1309    Clinical Impression Statement  Patient doing well today with all stretching and gentle strengtheing with good progression. Patient noting some beenfit from taping to bicep with less "shooting" pain experienced in this area. patient to follow up with MD on Friday with measurements and goals to be assessed at  that time. PT and patient discussing benefit from continued PT to continue to progress functional use of UE.     PT Treatment/Interventions  ADLs/Self Care Home Management;Cryotherapy;Electrical Stimulation;Iontophoresis 4mg /ml Dexamethasone;Moist Heat;Ultrasound;Therapeutic exercise;Therapeutic activities;Patient/family education;Manual techniques;Passive range of motion;Vasopneumatic Device;Taping;Dry needling    PT Next Visit Plan  note to MD    Consulted and Agree with Plan of Care  Patient       Patient will benefit from skilled therapeutic intervention in order to improve the following deficits and impairments:  Impaired UE functional use, Pain, Decreased strength, Decreased range of motion  Visit Diagnosis: Acute pain of left shoulder  Stiffness of left shoulder, not elsewhere classified  Other symptoms and signs involving the musculoskeletal system  Abnormal posture     Problem List Patient Active Problem List   Diagnosis Date Noted  . Closed fracture of proximal end of left humerus with routine healing, subsequent encounter 09/02/2017  . Nuclear sclerotic cataract of right eye 02/02/2017     [name], PT, DPT 10/21/17 2:23 PM    [facility] [place] [address]  [address] [place], [place], [address] Phone: [phone]   Fax:  [phone]  Name: Martin [name] MRN: [id number] Date of Birth: 1951/04/22

## 2017-10-21 NOTE — Patient Instructions (Signed)
Resisted External Rotation: in Neutral - Bilateral    Sit or stand, tubing in both hands, elbows at sides, bent to 90, forearms forward. Pinch shoulder blades together and rotate forearms out. Keep elbows at sides. Repeat __15__ times per set.  **elbows by side, palms toward ceiling, rotate hands away from each other**  Resisted Horizontal Abduction: Bilateral   Sit or stand, tubing in both hands, arms out in front. Keeping arms straight, pinch shoulder blades together and stretch arms out. Repeat _15___ times per set.

## 2017-10-23 ENCOUNTER — Encounter: Payer: Self-pay | Admitting: Physical Therapy

## 2017-10-23 ENCOUNTER — Ambulatory Visit: Payer: Medicare Other | Admitting: Physical Therapy

## 2017-10-23 DIAGNOSIS — M25612 Stiffness of left shoulder, not elsewhere classified: Secondary | ICD-10-CM | POA: Diagnosis not present

## 2017-10-23 DIAGNOSIS — R293 Abnormal posture: Secondary | ICD-10-CM | POA: Diagnosis not present

## 2017-10-23 DIAGNOSIS — R29898 Other symptoms and signs involving the musculoskeletal system: Secondary | ICD-10-CM

## 2017-10-23 DIAGNOSIS — M25512 Pain in left shoulder: Secondary | ICD-10-CM | POA: Diagnosis not present

## 2017-10-23 NOTE — Patient Instructions (Signed)
Abduction - Side-Lying (Dumbbell)    Lie with neck supported, right arm on hip. Lift straight arm toward ceiling. Repeat _15___ times per set.    External Rotation: Side-Lying (Dumbbell)    Lie with neck supported, left elbow bent to 90, forearm across stomach. Raise forearm, keeping elbow at side. Repeat _15___ times per set. Use _1-2___ lb weight.

## 2017-10-23 NOTE — Therapy (Signed)
[facility] [place] [address]  [address] [place], [place], [address] Phone: [phone]   Fax:  [phone]  Physical Therapy Treatment  Patient Details  Name: Martin [name] MRN: [id number] Date of Birth: 09/25/1951 Referring Provider: Dr. Norton BlizzardShane Hudnall   Encounter Date: 10/23/2017  PT End of Session - 10/23/17 1511    Visit Number  8    Number of Visits  12    Date for PT Re-Evaluation  11/06/17    Authorization Type  Medicare    PT Start Time  1357    PT Stop Time  1446    PT Time Calculation (min)  49 min    Activity Tolerance  Patient tolerated treatment well    Behavior During Therapy  [facility]WFL for tasks assessed/performed       History reviewed. No pertinent past medical history.  Past Surgical History:  Procedure Laterality Date  . CATARACT EXTRACTION      There were no vitals filed for this visit.  Subjective Assessment - 10/23/17 1400    Subjective  Patient reporting he could feel L arm a bit more today, likely attributable to weather, but no complaints of pain at this time.    Currently in Pain?  No/denies    Pain Score  0-No pain         [facility] PT Assessment - 10/23/17 1406      Assessment   Medical Diagnosis  Closed fracture of proximal end of L humerus    Referring Provider  Dr. Norton BlizzardShane Hudnall    Onset Date/Surgical Date  08/11/17    Hand Dominance  Right    Next MD Visit  10/24/17      AROM   Overall AROM Comments  patient reporting feeling limited by weakness    AROM Assessment Site  Shoulder    Right/Left Shoulder  Left    Left Shoulder Flexion  118 Degrees    Left Shoulder ABduction  105 Degrees AA with wall ladder and compensation up to 110 deg    Left Shoulder Internal Rotation  -- FIR to ~L2    Left Shoulder External Rotation  -- FER to ~C2      PROM   Overall PROM Comments  further motion limited by pain    PROM Assessment Site  Shoulder    Right/Left Shoulder  Left    Left Shoulder  Flexion  145 Degrees    Left Shoulder ABduction  95 Degrees with shoulder depression    Left Shoulder Internal Rotation  45 Degrees at 90 deg abduction    Left Shoulder External Rotation  60 Degrees at 90 degrees abduction      Palpation   Palpation comment  diffusely non-tender                  [facility] Adult PT Treatment/Exercise - 10/23/17 1401      Shoulder Exercises: Sidelying   External Rotation  Left;15 reps    External Rotation Limitations  1#; towel under elbow    ABduction  Left;15 reps 1 set no weight; 1 set 1#      Shoulder Exercises: ROM/Strengthening   UBE (Upper Arm Bike)  L2.5 x 6 min (3/3)    Other ROM/Strengthening Exercises  wall ladder; abduction; hold at top, eccentric lowering x10      Manual Therapy   Manual Therapy  Joint mobilization;Passive ROM    Manual therapy comments  patient supine    Joint  Mobilization  Grade II-[name] AP, PA, and inferior mobs to L GH joint - good tolerance    Passive ROM  PROM at L [facility] CenterGH joint into all planes; pain at end ranges of all motion limiting continued motion; ER/IR/abduction performed in slight scaption               PT Short Term Goals - 10/02/17 1547      PT SHORT TERM GOAL #1   Title  patient to be independent with initial HEP    Status  On-going      PT SHORT TERM GOAL #2   Title  Patient to improve PROM of L shoulder flexion and abduction to >/= 150 degrees with pain no greater than 2/10    Status  On-going        PT Long Term Goals - 10/02/17 1812      PT LONG TERM GOAL #1   Title  patient to be independent with advanced HEP    Status  On-going      PT LONG TERM GOAL #2   Title  patient to improve L shoulder AROM: flexion and abduction to >/= 150 degrees     Status  On-going      PT LONG TERM GOAL #3   Title  patient to improve L shoulder strength to >/= 4/5    Status  On-going      PT LONG TERM GOAL #4   Title  patient to report ability to perform ADLs and light household tasks  without limitation by pain or ROM    Status  On-going            Plan - 10/23/17 1745    Clinical Impression Statement  Reassess patient L shoulder ROM today and patient slowly making progress with strength and ROM, however continues to be most limited in abduction despite aggressive stretching and manual therapy. Patient being diligent with HEP and continuing to work towards goals. Patient to follow up with MD tomorrow and will schedule further PT visits after consult with MD.     PT Treatment/Interventions  ADLs/Self Care Home Management;Cryotherapy;Electrical Stimulation;Iontophoresis 4mg /ml Dexamethasone;Moist Heat;Ultrasound;Therapeutic exercise;Therapeutic activities;Patient/family education;Manual techniques;Passive range of motion;Vasopneumatic Device;Taping;Dry needling       Patient will benefit from skilled therapeutic intervention in order to improve the following deficits and impairments:  Impaired UE functional use, Pain, Decreased strength, Decreased range of motion  Visit Diagnosis: Acute pain of left shoulder  Stiffness of left shoulder, not elsewhere classified  Other symptoms and signs involving the musculoskeletal system  Abnormal posture     Problem List Patient Active Problem List   Diagnosis Date Noted  . Closed fracture of proximal end of left humerus with routine healing, subsequent encounter 09/02/2017  . Nuclear sclerotic cataract of right eye 02/02/2017     Emerson MonteKimberly Carolena Fairbank, SPT 10/23/17 5:50 PM    [facility] [place] [address]  [address] [place], [place], [address] Phone: [phone]   Fax:  [phone]  Name: Martin [name] MRN: [id number] Date of Birth: 1951-02-25

## 2017-10-24 ENCOUNTER — Ambulatory Visit (INDEPENDENT_AMBULATORY_CARE_PROVIDER_SITE_OTHER): Payer: Medicare Other | Admitting: Family Medicine

## 2017-10-24 ENCOUNTER — Encounter: Payer: Self-pay | Admitting: Family Medicine

## 2017-10-24 ENCOUNTER — Ambulatory Visit: Payer: Medicare Other | Admitting: Family Medicine

## 2017-10-24 DIAGNOSIS — S42202D Unspecified fracture of upper end of left humerus, subsequent encounter for fracture with routine healing: Secondary | ICD-10-CM

## 2017-10-24 NOTE — Progress Notes (Signed)
PCP: Patient, No Pcp Per  Subjective:   HPI: Patient is a 66 y.o. male here for left arm injury.  9/21: Patient reports on 9/1 he tripped and fell directly onto left arm. Immediate pain, difficulty moving about the shoulder on the left. Pain has improved since then and is 0/10 at rest, gets sharp if trying to move the arm though. Using a sling regularly. Is right handed. + swelling and bruising.  10/12: Patient reports he's doing well. Rarely if turning the wrong way will feel a 7/10 sharp pain left shoulder but it doesn't linger. Not taking any medicines. No night pain. Doing some home exercises. Still with some bruising laterally.  No other skin changes. No numbness.  11/16: Patient reports he's doing very well. Pain is 0/10 level even with home exercises. Motion has been improving slowly. Doing well in physical therapy and really appreciates their care, would like to continue. No swelling. No skin changes, numbness.  History reviewed. No pertinent past medical history.  Current Outpatient Medications on File Prior to Visit  Medication Sig Dispense Refill  . aspirin 81 MG chewable tablet Chew by mouth.    . latanoprost (XALATAN) 0.005 % ophthalmic solution     . meloxicam (MOBIC) 15 MG tablet Take 1 tablet (15 mg total) by mouth daily. 30 tablet 1  . timolol (TIMOPTIC) 0.5 % ophthalmic solution     . traMADol (ULTRAM) 50 MG tablet Take 1 tablet (50 mg total) by mouth every 6 (six) hours as needed. 15 tablet 0   No current facility-administered medications on file prior to visit.     Past Surgical History:  Procedure Laterality Date  . CATARACT EXTRACTION      No Known Allergies  Social History   Socioeconomic History  . Marital status: Single    Spouse name: Not on file  . Number of children: Not on file  . Years of education: Not on file  . Highest education level: Not on file  Social Needs  . Financial resource strain: Not on file  . Food insecurity -  worry: Not on file  . Food insecurity - inability: Not on file  . Transportation needs - medical: Not on file  . Transportation needs - non-medical: Not on file  Occupational History  . Not on file  Tobacco Use  . Smoking status: Current Every Day Smoker    Packs/day: 0.50  . Smokeless tobacco: Never Used  Substance and Sexual Activity  . Alcohol use: Yes    Comment: occ  . Drug use: No  . Sexual activity: Not on file  Other Topics Concern  . Not on file  Social History Narrative  . Not on file    History reviewed. No pertinent family history.  BP (!) 190/100   Pulse (!) 104   Ht 5\' 10"  (1.778 m)   Wt 156 lb (70.8 kg)   BMI 22.38 kg/m   Review of Systems: See HPI above.     Objective:  Physical Exam:  Gen: NAD, comfortable in exam room.  Left shoulder: No gross deformity. No TTP. ER 80 degrees, 100 degrees abduction and 110 degrees flexion. Strength 5-/5 with empty can and resisted external rotation.  5/5 IR. NV intact distally.  Right shoulder: FROM without pain.  Assessment & Plan:  1. Left proximal humerus fracture, closed - clinically healed from fracture and now working on regaining motion.  Consistent with adhesive capsulitis on top of this healed fracture.  Continue with physical  therapy and home exercises.  Tylenol, tramadol, meloxicam if needed.  F/u in 6 weeks.

## 2017-10-24 NOTE — Assessment & Plan Note (Signed)
clinically healed from fracture and now working on regaining motion.  Consistent with adhesive capsulitis on top of this healed fracture.  Continue with physical therapy and home exercises.  Tylenol, tramadol, meloxicam if needed.  F/u in 6 weeks.

## 2017-10-24 NOTE — Patient Instructions (Signed)
Continue with your physical therapy and home exercises. Right now it's just about regaining your motion. Follow up with me in 6 weeks (ok if it's a little longer than this because of the holiday).

## 2017-10-28 ENCOUNTER — Ambulatory Visit: Payer: Medicare Other | Admitting: Physical Therapy

## 2017-10-28 ENCOUNTER — Encounter: Payer: Self-pay | Admitting: Physical Therapy

## 2017-10-28 DIAGNOSIS — R29898 Other symptoms and signs involving the musculoskeletal system: Secondary | ICD-10-CM

## 2017-10-28 DIAGNOSIS — R293 Abnormal posture: Secondary | ICD-10-CM

## 2017-10-28 DIAGNOSIS — M25612 Stiffness of left shoulder, not elsewhere classified: Secondary | ICD-10-CM | POA: Diagnosis not present

## 2017-10-28 DIAGNOSIS — M25512 Pain in left shoulder: Secondary | ICD-10-CM

## 2017-10-28 NOTE — Patient Instructions (Signed)
Row: Low (Machine)    Body sitting up tall, pull handles to torso. Do __2__ sets. Complete __15__ repetitions. 20lbs.   Lats Pull Down (Eccentric), (Machine Weight)    Pull bar down toward chest. Slowly release for 3-5 seconds. Use __20_ lbs on machine. _10__ reps per set, _2__ sets per session.

## 2017-10-28 NOTE — Therapy (Signed)
[facility] [place] [address]  [address] [place], [place], [address] Phone: [phone]   Fax:  [phone]  Physical Therapy Treatment  Patient Details  Name: Martin NapWilliam Niccoli [name] MRN: [id number] Date of Birth: Feb 04, 1951 Referring Provider: Dr. Norton BlizzardShane Hudnall   Encounter Date: 10/28/2017  PT End of Session - 10/28/17 1541    Visit Number  9    Number of Visits  12    Date for PT Re-Evaluation  11/06/17    Authorization Type  Medicare    PT Start Time  1445    PT Stop Time  1532    PT Time Calculation (min)  47 min    Activity Tolerance  Patient tolerated treatment well    Behavior During Therapy  [facility] for tasks assessed/performed       History reviewed. No pertinent past medical history.  Past Surgical History:  Procedure Laterality Date  . CATARACT EXTRACTION      There were no vitals filed for this visit.  Subjective Assessment - 10/28/17 1444    Subjective  Patient feeling good today, stating Dr. Pearletha ForgeHudnall reported he was doing well with PT and should continue.     Currently in Pain?  No/denies    Pain Score  0-No pain                      [facility] Adult PT Treatment/Exercise - 10/28/17 1450      Shoulder Exercises: Standing   Other Standing Exercises  shoulder depression; red tband; 15 reps      Shoulder Exercises: ROM/Strengthening   UBE (Upper Arm Bike)  L2.5 x 6 min (3/3)    Cybex Row  15 reps 20#    Wall Wash  10 reps; 2# at wrist; abduction in slight scaption    Ball on Wall  walking orange pball up wall - 2# at wrist    Other ROM/Strengthening Exercises  lat pull down; 20#; 15 reps focus on eccentric return to stretch arm overhead      Manual Therapy   Manual Therapy  Joint mobilization;Passive ROM    Manual therapy comments  patient supine    Joint Mobilization  Grade [name]-IV; AP and inferior mobs to L GH joint inferior glides performed in scaption    Passive ROM  PROM at [facility] into all  planes; pain at end ranges of all motion limiting continued motion; ER/IR/abduction performed in slight scaption               PT Short Term Goals - 10/02/17 1547      PT SHORT TERM GOAL #1   Title  patient to be independent with initial HEP    Status  On-going      PT SHORT TERM GOAL #2   Title  Patient to improve PROM of L shoulder flexion and abduction to >/= 150 degrees with pain no greater than 2/10    Status  On-going        PT Long Term Goals - 10/02/17 1812      PT LONG TERM GOAL #1   Title  patient to be independent with advanced HEP    Status  On-going      PT LONG TERM GOAL #2   Title  patient to improve L shoulder AROM: flexion and abduction to >/= 150 degrees     Status  On-going      PT LONG TERM GOAL #3  Title  patient to improve L shoulder strength to >/= 4/5    Status  On-going      PT LONG TERM GOAL #4   Title  patient to report ability to perform ADLs and light household tasks without limitation by pain or ROM    Status  On-going            Plan - 10/28/17 1542    Clinical Impression Statement  Patient tolerated increased pressure at end range with PROM/stretching at L shoulder today. Discussed with patient importance of pushing through moderate pain in order to improve motion and funciton at shoulder and reduce risk for future complications. Talked with patient about machines to use for strengthening at the gym and added to HEP. Will contintue to progress per patient tolerance.     PT Treatment/Interventions  ADLs/Self Care Home Management;Cryotherapy;Electrical Stimulation;Iontophoresis 4mg /ml Dexamethasone;Moist Heat;Ultrasound;Therapeutic exercise;Therapeutic activities;Patient/family education;Manual techniques;Passive range of motion;Vasopneumatic Device;Taping;Dry needling    Consulted and Agree with Plan of Care  Patient       Patient will benefit from skilled therapeutic intervention in order to improve the following deficits and  impairments:  Impaired UE functional use, Pain, Decreased strength, Decreased range of motion  Visit Diagnosis: Acute pain of left shoulder  Stiffness of left shoulder, not elsewhere classified  Other symptoms and signs involving the musculoskeletal system  Abnormal posture     Problem List Patient Active Problem List   Diagnosis Date Noted  . Closed fracture of proximal end of left humerus with routine healing, subsequent encounter 09/02/2017  . Nuclear sclerotic cataract of right eye 02/02/2017     Emerson MonteKimberly Dashton Czerwinski, SPT 10/28/17 3:48 PM    [facility] [place] [address]  [address] [place], [place], [address] Phone: [phone]   Fax:  [phone]  Name: Martin NapWilliam Marcum [name] MRN: [id number] Date of Birth: 1951/03/21

## 2017-11-04 ENCOUNTER — Ambulatory Visit: Payer: Medicare Other | Admitting: Physical Therapy

## 2017-11-04 ENCOUNTER — Encounter: Payer: Self-pay | Admitting: Physical Therapy

## 2017-11-04 DIAGNOSIS — M25612 Stiffness of left shoulder, not elsewhere classified: Secondary | ICD-10-CM | POA: Diagnosis not present

## 2017-11-04 DIAGNOSIS — R29898 Other symptoms and signs involving the musculoskeletal system: Secondary | ICD-10-CM

## 2017-11-04 DIAGNOSIS — R293 Abnormal posture: Secondary | ICD-10-CM

## 2017-11-04 DIAGNOSIS — M25512 Pain in left shoulder: Secondary | ICD-10-CM | POA: Diagnosis not present

## 2017-11-04 NOTE — Addendum Note (Signed)
Addended by: Kipp LaurenceAARON, Jahanna Raether R on: 11/04/2017 03:41 PM   Modules accepted: Orders

## 2017-11-04 NOTE — Therapy (Signed)
[facility] [place] [address]  [address] [place], [place], [address] Phone: [phone]   Fax:  [phone]  Physical Therapy Treatment  Patient Details  Name: Martin [name] MRN: [id number] Date of Birth: Sep 09, 1951 Referring Provider: Dr. Norton BlizzardShane Hudnall   Encounter Date: 11/04/2017  PT End of Session - 11/04/17 1320    Visit Number  10    Number of Visits  20    Date for PT Re-Evaluation  12/16/17    Authorization Type  Medicare    PT Start Time  1316    PT Stop Time  1359    PT Time Calculation (min)  43 min    Activity Tolerance  Patient tolerated treatment well    Behavior During Therapy  [facility] for tasks assessed/performed       History reviewed. No pertinent past medical history.  Past Surgical History:  Procedure Laterality Date  . CATARACT EXTRACTION      There were no vitals filed for this visit.  Subjective Assessment - 11/04/17 1321    Subjective  Doing well today; compliant with HEP; only has pain with PT stretching     Diagnostic tests  xray - normal healing    Patient Stated Goals  improve function of shoulder/arm    Currently in Pain?  No/denies    Pain Score  0-No pain         [facility] PT Assessment - 11/04/17 0001      PROM   Left Shoulder ABduction  135 Degrees slight abduction    Left Shoulder External Rotation  65 Degrees after contract-relax                  [facility] Adult PT Treatment/Exercise - 11/04/17 0001      Shoulder Exercises: Standing   External Rotation  Left;15 reps;Theraband    Theraband Level (Shoulder External Rotation)  Level 2 (Red)    Internal Rotation  Left;15 reps;Theraband    Theraband Level (Shoulder Internal Rotation)  Level 3 (Green)      Shoulder Exercises: ROM/Strengthening   UBE (Upper Arm Bike)  L3.5 x 6 min (3/3)    Cybex Row  15 reps    Cybex Row Limitations  25#    Rhythmic Stabilization, Supine  L UE with red medball - CW/CCW x 15, ABC's; manual  by PT at proximal GH joint 4 x 30 seconds    Other ROM/Strengthening Exercises  BATCA pull down - 25# x 15 reps - slight shoulder hike when returning to rest position      Manual Therapy   Manual Therapy  Passive ROM    Manual therapy comments  patient supine    Passive ROM  PROM L GH joint all planes - overpressure into end ranges; contract relax with ER with 15 degrees of improvement               PT Short Term Goals - 11/04/17 1529      PT SHORT TERM GOAL #1   Title  patient to be independent with initial HEP    Status  Achieved      PT SHORT TERM GOAL #2   Status  On-going        PT Long Term Goals - 11/04/17 1529      PT LONG TERM GOAL #1   Title  patient to be independent with advanced HEP    Status  On-going      PT LONG  TERM GOAL #2   Title  patient to improve L shoulder AROM: flexion and abduction to >/= 150 degrees     Status  On-going      PT LONG TERM GOAL #3   Title  patient to improve L shoulder strength to >/= 4/5    Status  On-going      PT LONG TERM GOAL #4   Title  patient to report ability to perform ADLs and light household tasks without limitation by pain or ROM    Status  On-going            Plan - 11/04/17 1321    Clinical Impression Statement  Patient doing well today - achieving more PROM with manual stretching and contract-relax techniques. Patient doing well with all strengthening and stretching both independently and within clinic. Patient continues to be most limited by pain with stretching, however, becoming more tolerable and able to stretch into overpressure. Will plan to extend POC at this time.     PT Treatment/Interventions  ADLs/Self Care Home Management;Cryotherapy;Electrical Stimulation;Iontophoresis 4mg /ml Dexamethasone;Moist Heat;Ultrasound;Therapeutic exercise;Therapeutic activities;Patient/family education;Manual techniques;Passive range of motion;Vasopneumatic Device;Taping;Dry needling    Consulted and Agree with  Plan of Care  Patient       Patient will benefit from skilled therapeutic intervention in order to improve the following deficits and impairments:  Impaired UE functional use, Pain, Decreased strength, Decreased range of motion  Visit Diagnosis: Acute pain of left shoulder  Stiffness of left shoulder, not elsewhere classified  Other symptoms and signs involving the musculoskeletal system  Abnormal posture   G-Codes - 11/04/17 1322    Functional Assessment Tool Used (Outpatient Only)  FOTO: 62 (38% limited)    Functional Limitation  Carrying, moving and handling objects    Carrying, Moving and Handling Objects Current Status ([id number])  At least 20 percent but less than 40 percent impaired, limited or restricted    Carrying, Moving and Handling Objects Goal Status (U0454(G8985)  At least 20 percent but less than 40 percent impaired, limited or restricted       Problem List Patient Active Problem List   Diagnosis Date Noted  . Closed fracture of proximal end of left humerus with routine healing, subsequent encounter 09/02/2017  . Nuclear sclerotic cataract of right eye 02/02/2017     Kipp LaurenceStephanie R Aldin Drees, PT, DPT 11/04/17 3:40 PM   [facility] [place] [address]  [address] [place], [place], [address] Phone: [phone]   Fax:  [phone]  Name: Martin [name] MRN: [id number] Date of Birth: 05/16/1951

## 2017-11-06 ENCOUNTER — Ambulatory Visit: Payer: Medicare Other

## 2017-11-06 DIAGNOSIS — M25512 Pain in left shoulder: Secondary | ICD-10-CM | POA: Diagnosis not present

## 2017-11-06 DIAGNOSIS — M25612 Stiffness of left shoulder, not elsewhere classified: Secondary | ICD-10-CM

## 2017-11-06 DIAGNOSIS — R293 Abnormal posture: Secondary | ICD-10-CM | POA: Diagnosis not present

## 2017-11-06 DIAGNOSIS — R29898 Other symptoms and signs involving the musculoskeletal system: Secondary | ICD-10-CM | POA: Diagnosis not present

## 2017-11-06 NOTE — Therapy (Signed)
[name] [place] [address]  [address] [place], [place], [address] Phone: [phone]   Fax:  [phone]  Physical Therapy Treatment  Patient Details  Name: Martin [name] MRN: [id number] Date of Birth: 01-07-1951 Referring Provider: Dr. Norton BlizzardShane Hudnall   Encounter Date: 11/06/2017  PT End of Session - 11/06/17 1403    Visit Number  11    Number of Visits  20    Date for PT Re-Evaluation  12/16/17    Authorization Type  Medicare    PT Start Time  1359    PT Stop Time  1447    PT Time Calculation (min)  48 min    Activity Tolerance  Patient tolerated treatment well    Behavior During Therapy  [facility] for tasks assessed/performed       No past medical history on file.  Past Surgical History:  Procedure Laterality Date  . CATARACT EXTRACTION      There were no vitals filed for this visit.  Subjective Assessment - 11/06/17 1402    Subjective  Pt. reporting putting on belt and donning coat has become a little easier.      Diagnostic tests  xray - normal healing    Patient Stated Goals  improve function of shoulder/arm    Currently in Pain?  No/denies    Pain Score  0-No pain    Multiple Pain Sites  No                      [facility] Adult PT Treatment/Exercise - 11/06/17 1423      Shoulder Exercises: Standing   Other Standing Exercises  L shoulder depression; green x 15 reps green band closed high in door      Shoulder Exercises: ROM/Strengthening   UBE (Upper Arm Bike)  L3.5 x 6 min (3/3)    Cybex Row  15 reps 3" hold     Cybex Row Limitations  25#    Other ROM/Strengthening Exercises  BATCA pull down - 25# x 15 reps - 10" stretch at top with cueing for forward trunk flexion to enhance stretch       Shoulder Exercises: Stretch   Corner Stretch  1 rep;30 seconds manual overpressure into stretch from therapist    Corner Stretch Limitations  high and low     Other Shoulder Stretches  --    Other Shoulder  Stretches  L lats stretch x 30 sec on doorseal       Manual Therapy   Manual Therapy  Passive ROM    Manual therapy comments  patient supine    Joint Mobilization  Grade [name]-IV; AP and inferior mobs to L GH joint in varying ranges     Passive ROM  PROM L GH joint all planes - overpressure into end ranges; contract relax into all motions                PT Short Term Goals - 11/04/17 1529      PT SHORT TERM GOAL #1   Title  patient to be independent with initial HEP    Status  Achieved      PT SHORT TERM GOAL #2   Status  On-going        PT Long Term Goals - 11/04/17 1529      PT LONG TERM GOAL #1   Title  patient to be independent with advanced HEP    Status  On-going  PT LONG TERM GOAL #2   Title  patient to improve L shoulder AROM: flexion and abduction to >/= 150 degrees     Status  On-going      PT LONG TERM GOAL #3   Title  patient to improve L shoulder strength to >/= 4/5    Status  On-going      PT LONG TERM GOAL #4   Title  patient to report ability to perform ADLs and light household tasks without limitation by pain or ROM    Status  On-going            Plan - 11/06/17 1445    Clinical Impression Statement  [name] StableBill doing well today reporting greater ease donning coat and putting on belt.  Tolerated contract/relax stretching into all motions at L shoulder well today for improved ROM.  Tactile cueing throughout therex today to enhance stretching and for proper muscular activation.  Pt. continues to feel most pain with passive stretching at end range with complete relief following this.  Will continue to progress ROM and strengthening as pt. able in coming visits.      PT Treatment/Interventions  ADLs/Self Care Home Management;Cryotherapy;Electrical Stimulation;Iontophoresis 4mg /ml Dexamethasone;Moist Heat;Ultrasound;Therapeutic exercise;Therapeutic activities;Patient/family education;Manual techniques;Passive range of motion;Vasopneumatic  Device;Taping;Dry needling       Patient will benefit from skilled therapeutic intervention in order to improve the following deficits and impairments:  Impaired UE functional use, Pain, Decreased strength, Decreased range of motion  Visit Diagnosis: Acute pain of left shoulder  Stiffness of left shoulder, not elsewhere classified  Other symptoms and signs involving the musculoskeletal system  Abnormal posture     Problem List Patient Active Problem List   Diagnosis Date Noted  . Closed fracture of proximal end of left humerus with routine healing, subsequent encounter 09/02/2017  . Nuclear sclerotic cataract of right eye 02/02/2017    Kermit BaloMicah Retina Bernardy, PTA 11/06/17 4:53 PM  [facility] [place] [address]  [address] [place], [place], [address] Phone: [phone]   Fax:  [phone]  Name: Martin [name] MRN: [id number] Date of Birth: 1951/05/03

## 2017-11-11 ENCOUNTER — Ambulatory Visit: Payer: Medicare Other | Attending: Family Medicine | Admitting: Physical Therapy

## 2017-11-11 ENCOUNTER — Encounter: Payer: Self-pay | Admitting: Physical Therapy

## 2017-11-11 DIAGNOSIS — M25612 Stiffness of left shoulder, not elsewhere classified: Secondary | ICD-10-CM

## 2017-11-11 DIAGNOSIS — M25512 Pain in left shoulder: Secondary | ICD-10-CM | POA: Insufficient documentation

## 2017-11-11 DIAGNOSIS — R29898 Other symptoms and signs involving the musculoskeletal system: Secondary | ICD-10-CM | POA: Diagnosis not present

## 2017-11-11 DIAGNOSIS — R293 Abnormal posture: Secondary | ICD-10-CM | POA: Insufficient documentation

## 2017-11-11 NOTE — Therapy (Signed)
[facility] [place] [address]  [address] [place], [place], [address] Phone: [phone]   Fax:  [phone]  Physical Therapy Treatment  Patient Details  Name: Martin [name] MRN: [id number] Date of Birth: 1951/12/06 Referring Provider: Dr. Norton BlizzardShane Hudnall   Encounter Date: 11/11/2017  PT End of Session - 11/11/17 1417    Visit Number  12    Number of Visits  20    Date for PT Re-Evaluation  12/16/17    Authorization Type  Medicare    PT Start Time  1313    PT Stop Time  1401    PT Time Calculation (min)  48 min    Activity Tolerance  Patient tolerated treatment well    Behavior During Therapy  [facility]WFL for tasks assessed/performed       History reviewed. No pertinent past medical history.  Past Surgical History:  Procedure Laterality Date  . CATARACT EXTRACTION      There were no vitals filed for this visit.  Subjective Assessment - 11/11/17 1315    Subjective  Patient doing well today, no new complaints. States he notices some improvements in motion at night after using the arm, but stiffens back up by morning.     Diagnostic tests  xray - normal healing    Patient Stated Goals  improve function of shoulder/arm    Currently in Pain?  No/denies    Pain Score  0-No pain                      [facility] Adult PT Treatment/Exercise - 11/11/17 1318      Shoulder Exercises: Seated   Other Seated Exercises  PT move L arm passively to end range, patient active hold at end range, eccentric lower; 5 reps flexion, 5 reps abduction      Shoulder Exercises: Prone   Other Prone Exercises  I, T, Y on green ball; 10 reps each cueing for L arm positioning and technique    Other Prone Exercises  multidirection movements in quadruped - yellow tband - 10 reps       Shoulder Exercises: ROM/Strengthening   UBE (Upper Arm Bike)  L3.5 x 6 min (3/3)      Manual Therapy   Manual Therapy  Passive ROM    Manual therapy comments   patient supine    Passive ROM  PROM L GH joint all planes - over pressure at end range with pain limiting; contract relax into abduction, ER               PT Short Term Goals - 11/04/17 1529      PT SHORT TERM GOAL #1   Title  patient to be independent with initial HEP    Status  Achieved      PT SHORT TERM GOAL #2   Status  On-going        PT Long Term Goals - 11/04/17 1529      PT LONG TERM GOAL #1   Title  patient to be independent with advanced HEP    Status  On-going      PT LONG TERM GOAL #2   Title  patient to improve L shoulder AROM: flexion and abduction to >/= 150 degrees     Status  On-going      PT LONG TERM GOAL #3   Title  patient to improve L shoulder strength to >/= 4/5    Status  On-going      PT LONG TERM GOAL #4   Title  patient to report ability to perform ADLs and light household tasks without limitation by pain or ROM    Status  On-going            Plan - 11/11/17 1418    Clinical Impression Statement  Patient progressing well with ROM and strengthening activities. Continuing to be limited by pain and end range in all directions, however patient able to tolerate more aggressive stretching today. Patient continuing to require verbal cueing to eliminate compensation by upper trap for increased shoulder motion, but compensation less pronounced that previous sessions. Will continue to progress to patient tolerance.     PT Treatment/Interventions  ADLs/Self Care Home Management;Cryotherapy;Electrical Stimulation;Iontophoresis 4mg /ml Dexamethasone;Moist Heat;Ultrasound;Therapeutic exercise;Therapeutic activities;Patient/family education;Manual techniques;Passive range of motion;Vasopneumatic Device;Taping;Dry needling    Consulted and Agree with Plan of Care  Patient       Patient will benefit from skilled therapeutic intervention in order to improve the following deficits and impairments:  Impaired UE functional use, Pain, Decreased strength,  Decreased range of motion  Visit Diagnosis: Acute pain of left shoulder  Stiffness of left shoulder, not elsewhere classified  Other symptoms and signs involving the musculoskeletal system  Abnormal posture     Problem List Patient Active Problem List   Diagnosis Date Noted  . Closed fracture of proximal end of left humerus with routine healing, subsequent encounter 09/02/2017  . Nuclear sclerotic cataract of right eye 02/02/2017     Emerson MonteKimberly Auguste Tebbetts, SPT 11/11/17 2:25 PM    [facility] [place] [address]  [address] [place], [place], [address] Phone: [phone]   Fax:  [phone]  Name: Martin [name] MRN: [id number] Date of Birth: [birth year]

## 2017-11-13 ENCOUNTER — Ambulatory Visit: Payer: Medicare Other

## 2017-11-13 DIAGNOSIS — R293 Abnormal posture: Secondary | ICD-10-CM | POA: Diagnosis not present

## 2017-11-13 DIAGNOSIS — M25612 Stiffness of left shoulder, not elsewhere classified: Secondary | ICD-10-CM | POA: Diagnosis not present

## 2017-11-13 DIAGNOSIS — M25512 Pain in left shoulder: Secondary | ICD-10-CM

## 2017-11-13 DIAGNOSIS — R29898 Other symptoms and signs involving the musculoskeletal system: Secondary | ICD-10-CM

## 2017-11-13 NOTE — Therapy (Signed)
[facility] [place] [address]  [address] [place], [place], [address] Phone: [phone]   Fax:  [phone]  Physical Therapy Treatment  Patient Details  Name: Martin [name] MRN: [id number] Date of Birth: 09-28-1951 Referring Provider: Dr. Norton BlizzardShane Hudnall   Encounter Date: 11/13/2017  PT End of Session - 11/13/17 1403    Visit Number  13    Number of Visits  20    Date for PT Re-Evaluation  12/16/17    Authorization Type  Medicare    PT Start Time  1400    PT Stop Time  1443    PT Time Calculation (min)  43 min    Activity Tolerance  Patient tolerated treatment well    Behavior During Therapy  [facility] for tasks assessed/performed       No past medical history on file.  Past Surgical History:  Procedure Laterality Date  . CATARACT EXTRACTION      There were no vitals filed for this visit.  Subjective Assessment - 11/13/17 1402    Subjective  Pt. reporting, "the only time I hurt is when i'm stretching in therapy."    Patient Stated Goals  improve function of shoulder/arm    Currently in Pain?  No/denies    Pain Score  0-No pain    Multiple Pain Sites  No                      [facility] Adult PT Treatment/Exercise - 11/13/17 1430      Shoulder Exercises: Prone   Flexion  Left;15 reps    Flexion Limitations  "Y" with manual scapular guidance for upward rotation      Shoulder Exercises: Standing   Flexion  Both;10 reps    Flexion Limitations  mirror; to shoulder height     Other Standing Exercises  L shoulder depression; green x 15 reps    Other Standing Exercises  Red TB row/depression (band high in door) x 15 reps       Shoulder Exercises: ROM/Strengthening   UBE (Upper Arm Bike)  L3.5 x 6 min (3/3)    Cybex Row  15 reps    Cybex Row Limitations  10# L only       Shoulder Exercises: Stretch   Cross Chest Stretch  1 rep;60 seconds    Cross Chest Stretch Limitations  Hooklying chest stretch on 1/2 foam  bolster x 60 sec with STM to B pecs and overpressure      Manual Therapy   Manual Therapy  Passive ROM    Manual therapy comments  patient supine    Scapular Mobilization  L scapular mobs all directions with cueing for pt. to assist motion     Passive ROM  PROM L GH joint all planes - over pressure at end range with pain limiting; contract relax into all motions                PT Short Term Goals - 11/04/17 1529      PT SHORT TERM GOAL #1   Title  patient to be independent with initial HEP    Status  Achieved      PT SHORT TERM GOAL #2   Status  On-going        PT Long Term Goals - 11/04/17 1529      PT LONG TERM GOAL #1   Title  patient to be independent with advanced HEP  Status  On-going      PT LONG TERM GOAL #2   Title  patient to improve L shoulder AROM: flexion and abduction to >/= 150 degrees     Status  On-going      PT LONG TERM GOAL #3   Title  patient to improve L shoulder strength to >/= 4/5    Status  On-going      PT LONG TERM GOAL #4   Title  patient to report ability to perform ADLs and light household tasks without limitation by pain or ROM    Status  On-going            Plan - 11/13/17 1406    Clinical Impression Statement  Pt. reporting, "I only have shoulder pain when I'm getting stretched here in therapy".  Feels his strength is improving.  Bill with good tolerance for all contract/relax and aggressive L shoulder stretching today.  Continues to be limited by pain at end ranges.  Some manual work today to improve scapular motion with shoulder elevation.  Mirror training utilized for feedback with elevation to encourage pt. to avoid shoulder "hike".  Pt. will continue to benefit from further skilled therapy to improve functional strength and ROM.      PT Treatment/Interventions  ADLs/Self Care Home Management;Cryotherapy;Electrical Stimulation;Iontophoresis 4mg /ml Dexamethasone;Moist Heat;Ultrasound;Therapeutic exercise;Therapeutic  activities;Patient/family education;Manual techniques;Passive range of motion;Vasopneumatic Device;Taping;Dry needling       Patient will benefit from skilled therapeutic intervention in order to improve the following deficits and impairments:  Impaired UE functional use, Pain, Decreased strength, Decreased range of motion  Visit Diagnosis: Acute pain of left shoulder  Stiffness of left shoulder, not elsewhere classified  Other symptoms and signs involving the musculoskeletal system  Abnormal posture     Problem List Patient Active Problem List   Diagnosis Date Noted  . Closed fracture of proximal end of left humerus with routine healing, subsequent encounter 09/02/2017  . Nuclear sclerotic cataract of right eye 02/02/2017    Kermit BaloMicah Gyan Cambre, PTA 11/13/17 4:55 PM  [facility] [place] [address]  [address] [place], [place], [address] Phone: [phone]   Fax:  [phone]  Name: Martin [name] MRN: [id number] Date of Birth: 01-Oct-1951

## 2017-11-18 ENCOUNTER — Ambulatory Visit: Payer: Medicare Other

## 2017-11-18 DIAGNOSIS — M25612 Stiffness of left shoulder, not elsewhere classified: Secondary | ICD-10-CM | POA: Diagnosis not present

## 2017-11-18 DIAGNOSIS — R29898 Other symptoms and signs involving the musculoskeletal system: Secondary | ICD-10-CM | POA: Diagnosis not present

## 2017-11-18 DIAGNOSIS — M25512 Pain in left shoulder: Secondary | ICD-10-CM

## 2017-11-18 DIAGNOSIS — R293 Abnormal posture: Secondary | ICD-10-CM | POA: Diagnosis not present

## 2017-11-18 NOTE — Therapy (Addendum)
[facility] [address]  [address] [place], [place], [address] Phone: [phone]   Fax:  [phone]  Physical Therapy Treatment  Patient Details  Name: Martin NapWilliam Reith [name] MRN: [id number] Date of Birth: 10-24-1951 Referring Provider: Dr. Norton BlizzardShane Hudnall   Encounter Date: 11/18/2017  PT End of Session - 11/18/17 1312    Visit Number  14    Number of Visits  20    Date for PT Re-Evaluation  12/16/17    Authorization Type  Medicare    PT Start Time  1309    PT Stop Time  1359    PT Time Calculation (min)  50 min    Activity Tolerance  Patient tolerated treatment well    Behavior During Therapy  [facility] for tasks assessed/performed       No past medical history on file.  Past Surgical History:  Procedure Laterality Date  . CATARACT EXTRACTION      There were no vitals filed for this visit.  Subjective Assessment - 11/18/17 1310    Subjective  Pt. reporting no changes or new complaints since last visit.  Had some short lasting pain today while reaching behind back.      Diagnostic tests  xray - normal healing    Patient Stated Goals  improve function of shoulder/arm    Currently in Pain?  No/denies    Pain Score  0-No pain    Multiple Pain Sites  No                      [facility] Adult PT Treatment/Exercise - 11/18/17 1332      Shoulder Exercises: Prone   Extension  10 reps;Weights;Both    Extension Weight (lbs)  1    Extension Limitations  "I" prone on p-ball     External Rotation  10 reps;Both    External Rotation Weight (lbs)  --    External Rotation Limitations  "W" prone on p-ball     Horizontal ABduction 1  10 reps;Weights;Both    Horizontal ABduction 1 Weight (lbs)  1    Horizontal ABduction 1 Limitations  "T" prone on p-ball     Horizontal ABduction 2  10 reps;Both    Horizontal ABduction 2 Weight (lbs)  --    Horizontal ABduction 2 Limitations  "Y" prone on p-ball     Other Prone Exercises   Quadruped L UE 3# reach and tap to 4" step (out, front, and across body) x 5 reps each way       Shoulder Exercises: Standing   Flexion  Left;10 reps;Weights    Shoulder Flexion Weight (lbs)  1    Flexion Limitations  therapist assist on concentric for full ROM then pt. ecc    ABduction  Left;10 reps;Weights    Shoulder ABduction Weight (lbs)  1    ABduction Limitations  therapist assist for full ROM con then pt. eccentric       Shoulder Exercises: ROM/Strengthening   UBE (Upper Arm Bike)  L3.5 x 6 min (3/3)    Wall Pushups  15 reps    Wall Pushups Limitations  on p-ball on wall       Shoulder Exercises: Stretch   External Rotation Stretch  2 reps;30 seconds    External Rotation Stretch Limitations  L on doorseal      Manual Therapy   Manual Therapy  Passive ROM;Soft tissue mobilization;Joint mobilization    Manual therapy comments  patient supine    Joint Mobilization  Grade [name]-IV; AP and inferior mobs to L GH joint in varying ranges     Soft tissue mobilization  STM to L teres major in end range scaption stretch; some tenderness     Passive ROM  PROM L GH joint all planes - over pressure at end range with pain limiting; contract relax into all ER, flexion, abduction              PT Education - 11/19/17 0910    Education provided  Yes    Education Details  ER / pec stretch     Person(s) Educated  Patient    Methods  Explanation;Demonstration;Tactile cues;Handout    Comprehension  Verbalized understanding;Returned demonstration;Verbal cues required;Need further instruction       PT Short Term Goals - 11/04/17 1529      PT SHORT TERM GOAL #1   Title  patient to be independent with initial HEP    Status  Achieved      PT SHORT TERM GOAL #2   Status  On-going        PT Long Term Goals - 11/04/17 1529      PT LONG TERM GOAL #1   Title  patient to be independent with advanced HEP    Status  On-going      PT LONG TERM GOAL #2   Title  patient to improve L  shoulder AROM: flexion and abduction to >/= 150 degrees     Status  On-going      PT LONG TERM GOAL #3   Title  patient to improve L shoulder strength to >/= 4/5    Status  On-going      PT LONG TERM GOAL #4   Title  patient to report ability to perform ADLs and light household tasks without limitation by pain or ROM    Status  On-going            Plan - 11/18/17 1312    Clinical Impression Statement  [name] Stable continues to tolerate all strengthening and ROM activities in treatment well.  Only reported pain in treatment continues to be with end range stretching however, pt. demonstrating good reduction in muscular guarding with contract/relax stretching techniques.  Some tenderness/tightness in Teres Major area today likely contributing to remaining flexion/abduction ROM restrictions thus focused manual work to this area for hopeful improvement in ROM.  Will continue to progress ROM and strengthening activities as pt. tolerates in coming visits.      PT Treatment/Interventions  ADLs/Self Care Home Management;Cryotherapy;Electrical Stimulation;Iontophoresis 4mg /ml Dexamethasone;Moist Heat;Ultrasound;Therapeutic exercise;Therapeutic activities;Patient/family education;Manual techniques;Passive range of motion;Vasopneumatic Device;Taping;Dry needling       Patient will benefit from skilled therapeutic intervention in order to improve the following deficits and impairments:  Impaired UE functional use, Pain, Decreased strength, Decreased range of motion  Visit Diagnosis: Acute pain of left shoulder  Stiffness of left shoulder, not elsewhere classified  Other symptoms and signs involving the musculoskeletal system  Abnormal posture     Problem List Patient Active Problem List   Diagnosis Date Noted  . Closed fracture of proximal end of left humerus with routine healing, subsequent encounter 09/02/2017  . Nuclear sclerotic cataract of right eye 02/02/2017    Martin [name],  PTA 11/19/17 9:10 AM  [facility] [address]  [address] [place], [place], [address] Phone: [phone]   Fax:  [phone]  Name: Rydell Wiegel [name] MRN: [id number]  Date of Birth: 07/03/1951

## 2017-11-20 ENCOUNTER — Encounter: Payer: Self-pay | Admitting: Physical Therapy

## 2017-11-20 ENCOUNTER — Ambulatory Visit: Payer: Medicare Other | Admitting: Physical Therapy

## 2017-11-20 DIAGNOSIS — M25512 Pain in left shoulder: Secondary | ICD-10-CM | POA: Diagnosis not present

## 2017-11-20 DIAGNOSIS — R29898 Other symptoms and signs involving the musculoskeletal system: Secondary | ICD-10-CM | POA: Diagnosis not present

## 2017-11-20 DIAGNOSIS — M25612 Stiffness of left shoulder, not elsewhere classified: Secondary | ICD-10-CM | POA: Diagnosis not present

## 2017-11-20 DIAGNOSIS — R293 Abnormal posture: Secondary | ICD-10-CM

## 2017-11-20 NOTE — Therapy (Signed)
[facility] [place] [address]  [address] [place], [place], [address] Phone: [phone]   Fax:  [phone]  Physical Therapy Treatment  Patient Details  Name: Martin [name] MRN: [id number] Date of Birth: 1951-01-20 Referring Provider: Dr. Norton BlizzardShane Hudnall   Encounter Date: 11/20/2017  PT End of Session - 11/20/17 1539    Visit Number  16    Number of Visits  20    Date for PT Re-Evaluation  12/16/17    Authorization Type  Medicare    PT Start Time  1357    PT Stop Time  1445    PT Time Calculation (min)  48 min    Activity Tolerance  Patient tolerated treatment well    Behavior During Therapy  [facility]villeW[facility] for tasks assessed/performed       History reviewed. No pertinent past medical history.  Past Surgical History:  Procedure Laterality Date  . CATARACT EXTRACTION      There were no vitals filed for this visit.  Subjective Assessment - 11/20/17 1359    Subjective  Patient feeling good today. Had to lift heavy metal ramp at work today, no issues.    Diagnostic tests  xray - normal healing    Currently in Pain?  No/denies    Pain Score  0-No pain                      [facility] Adult PT Treatment/Exercise - 11/20/17 1401      Shoulder Exercises: Prone   Other Prone Exercises  L row + ER; 3#; 15 reps    Other Prone Exercises  prone snow angel - arms at 90/90, scap squeeze, press arms overhead - 15 reps min assist for patient technique      Shoulder Exercises: Standing   ABduction  Left;10 reps    Shoulder ABduction Weight (lbs)  2    Other Standing Exercises  B curl + overhead press; 4#; 15 reps; ROM to patient tolerance      Shoulder Exercises: ROM/Strengthening   UBE (Upper Arm Bike)  L3.5 x 6 min (3/3)    Cybex Row  15 reps    Cybex Row Limitations  25#      Manual Therapy   Manual Therapy  Joint mobilization;Passive ROM    Manual therapy comments  patient supine    Joint Mobilization  Grade [name]-IV;  AP and inferior mobs to L GH joint in varying ranges     Passive ROM  PROM L GH joint all planes - over pressure at end range with pain limiting             PT Education - 11/19/17 0910    Education provided  Yes    Education Details  ER / pec stretch     Person(s) Educated  Patient    Methods  Explanation;Demonstration;Tactile cues;Handout    Comprehension  Verbalized understanding;Returned demonstration;Verbal cues required;Need further instruction       PT Short Term Goals - 11/04/17 1529      PT SHORT TERM GOAL #1   Title  patient to be independent with initial HEP    Status  Achieved      PT SHORT TERM GOAL #2   Status  On-going        PT Long Term Goals - 11/04/17 1529      PT LONG TERM GOAL #1   Title  patient to be independent with advanced HEP  Status  On-going      PT LONG TERM GOAL #2   Title  patient to improve L shoulder AROM: flexion and abduction to >/= 150 degrees     Status  On-going      PT LONG TERM GOAL #3   Title  patient to improve L shoulder strength to >/= 4/5    Status  On-going      PT LONG TERM GOAL #4   Title  patient to report ability to perform ADLs and light household tasks without limitation by pain or ROM    Status  On-going            Plan - 11/20/17 1540    Clinical Impression Statement  Patient continuing to have slow but steady improvements in ROM and guarding with upper trap. Continuing to work on maintaining upright posture and strenghtening of rotator cuff musculature. Patient with fewer reports of pain at end range flexion and abduction.     PT Treatment/Interventions  ADLs/Self Care Home Management;Cryotherapy;Electrical Stimulation;Iontophoresis 4mg /ml Dexamethasone;Moist Heat;Ultrasound;Therapeutic exercise;Therapeutic activities;Patient/family education;Manual techniques;Passive range of motion;Vasopneumatic Device;Taping;Dry needling    Consulted and Agree with Plan of Care  Patient       Patient will  benefit from skilled therapeutic intervention in order to improve the following deficits and impairments:  Impaired UE functional use, Pain, Decreased strength, Decreased range of motion  Visit Diagnosis: Acute pain of left shoulder  Stiffness of left shoulder, not elsewhere classified  Other symptoms and signs involving the musculoskeletal system  Abnormal posture     Problem List Patient Active Problem List   Diagnosis Date Noted  . Closed fracture of proximal end of left humerus with routine healing, subsequent encounter 09/02/2017  . Nuclear sclerotic cataract of right eye 02/02/2017     Emerson MonteKimberly Kable Haywood, SPT 11/20/17 3:50 PM    [facility] [place] [address]  [address] [place], [place], [address] Phone: [phone]   Fax:  [phone]  Name: Martin [name] MRN: [id number] Date of Birth: 1951-05-10

## 2017-11-25 ENCOUNTER — Ambulatory Visit: Payer: Medicare Other

## 2017-11-25 DIAGNOSIS — M25512 Pain in left shoulder: Secondary | ICD-10-CM | POA: Diagnosis not present

## 2017-11-25 DIAGNOSIS — M25612 Stiffness of left shoulder, not elsewhere classified: Secondary | ICD-10-CM

## 2017-11-25 DIAGNOSIS — R293 Abnormal posture: Secondary | ICD-10-CM | POA: Diagnosis not present

## 2017-11-25 DIAGNOSIS — R29898 Other symptoms and signs involving the musculoskeletal system: Secondary | ICD-10-CM

## 2017-11-25 NOTE — Therapy (Addendum)
[facility] [place] [address]  [address] [place], [place], [address] Phone: [phone]   Fax:  [phone]  Physical Therapy Treatment  Patient Details  Name: Martin [name] MRN: [id number] Date of Birth: Jul 18, 1951 Referring Provider: Dr. Norton BlizzardShane Hudnall   Encounter Date: 11/25/2017  PT End of Session - 11/25/17 1314    Visit Number  16 visit counter corrected from previous visit    Number of Visits  20    Date for PT Re-Evaluation  12/16/17    Authorization Type  Medicare    PT Start Time  1312    PT Stop Time  1400    PT Time Calculation (min)  48 min    Activity Tolerance  Patient tolerated treatment well    Behavior During Therapy  [facility] HealthcareWFL for tasks assessed/performed       No past medical history on file.  Past Surgical History:  Procedure Laterality Date  . CATARACT EXTRACTION      There were no vitals filed for this visit.  Subjective Assessment - 11/25/17 1313    Subjective  Pt. reporting low-level dull soreness in L biceps area while driving over to treatment today without Identifiable trigger.  Pt. reporting evaluation for cataracts with MD on 1.10.18.    Diagnostic tests  xray - normal healing    Patient Stated Goals  improve function of shoulder/arm    Currently in Pain?  No/denies    Pain Score  0-No pain    Multiple Pain Sites  No                      [facility] Adult PT Treatment/Exercise - 11/25/17 1331      Shoulder Exercises: Supine   External Rotation  Both;10 reps;Theraband 5" hold     Theraband Level (Shoulder External Rotation)  Level 2 (Red)    External Rotation Limitations  hooklying on 1/2 fom roll    Flexion  Theraband;10 reps;Right;Left    Theraband Level (Shoulder Flexion)  Level 2 (Red)    Flexion Limitations  Alternating flexion/extension hooklying on 1/2 foam bolster       Shoulder Exercises: Standing   Other Standing Exercises  L shoulder D1/D2 flexion/extension with  yellow TB x 10 reps; cueing required throughout for proper technique  required cueing for technique for all except D1 flexion       Shoulder Exercises: ROM/Strengthening   UBE (Upper Arm Bike)  L3.5 x 6 min (3/3)    Wall Pushups  15 reps    Wall Pushups Limitations  on p-ball on wall     Other ROM/Strengthening Exercises  L shoulder flexion, ER (at 90 dg abd) med ball bounce (1000Gr) on wall x 30 sec each way; tactile cueing required for proper positioning      Shoulder Exercises: Stretch   Other Shoulder Stretches  L posterior shoulder stretch x 30 sec       Manual Therapy   Manual Therapy  Joint mobilization;Passive ROM    Manual therapy comments  patient supine    Joint Mobilization  Grade [name]-IV; AP and inferior mobs to L GH joint in varying ranges              PT Education - 11/25/17 1538    Education provided  Yes    Education Details  D1 flexion with yellow band pt. already has     Person(s) Educated  Patient    Methods  Explanation;Demonstration;Verbal  cues;Handout    Comprehension  Verbalized understanding;Returned demonstration;Verbal cues required;Need further instruction       PT Short Term Goals - 11/04/17 1529      PT SHORT TERM GOAL #1   Title  patient to be independent with initial HEP    Status  Achieved      PT SHORT TERM GOAL #2   Status  On-going        PT Long Term Goals - 11/04/17 1529      PT LONG TERM GOAL #1   Title  patient to be independent with advanced HEP    Status  On-going      PT LONG TERM GOAL #2   Title  patient to improve L shoulder AROM: flexion and abduction to >/= 150 degrees     Status  On-going      PT LONG TERM GOAL #3   Title  patient to improve L shoulder strength to >/= 4/5    Status  On-going      PT LONG TERM GOAL #4   Title  patient to report ability to perform ADLs and light household tasks without limitation by pain or ROM    Status  On-going            Plan - 11/25/17 1315    Clinical Impression  Statement  [name] StableBill doing well today with all strengthening and rhythmic stabilization activities in treatment.  Continues to demo reduction in guarding and improvement in PROM following stretching and contract/relax stretching techniques.  Bill reports he feels his strength is improving steadily and is now reporting main limitation with carrying objects extended away from body at shoulder height and above.  Progressing well toward goals at this point.      PT Treatment/Interventions  ADLs/Self Care Home Management;Cryotherapy;Electrical Stimulation;Iontophoresis 4mg /ml Dexamethasone;Moist Heat;Ultrasound;Therapeutic exercise;Therapeutic activities;Patient/family education;Manual techniques;Passive range of motion;Vasopneumatic Device;Taping;Dry needling       Patient will benefit from skilled therapeutic intervention in order to improve the following deficits and impairments:  Impaired UE functional use, Pain, Decreased strength, Decreased range of motion  Visit Diagnosis: Acute pain of left shoulder  Stiffness of left shoulder, not elsewhere classified  Other symptoms and signs involving the musculoskeletal system  Abnormal posture     Problem List Patient Active Problem List   Diagnosis Date Noted  . Closed fracture of proximal end of left humerus with routine healing, subsequent encounter 09/02/2017  . Nuclear sclerotic cataract of right eye 02/02/2017    Kermit BaloMicah Jaelah Hauth, PTA 11/25/17 3:39 PM  [facility] [place] [address]  [address] [place], [place], [address] Phone: [phone]   Fax:  [phone]  Name: Martin [name] MRN: [id number] Date of Birth: 1951-07-29

## 2017-11-27 ENCOUNTER — Encounter: Payer: Self-pay | Admitting: Physical Therapy

## 2017-11-27 ENCOUNTER — Ambulatory Visit: Payer: Medicare Other | Admitting: Physical Therapy

## 2017-11-27 DIAGNOSIS — R29898 Other symptoms and signs involving the musculoskeletal system: Secondary | ICD-10-CM | POA: Diagnosis not present

## 2017-11-27 DIAGNOSIS — M25612 Stiffness of left shoulder, not elsewhere classified: Secondary | ICD-10-CM | POA: Diagnosis not present

## 2017-11-27 DIAGNOSIS — R293 Abnormal posture: Secondary | ICD-10-CM | POA: Diagnosis not present

## 2017-11-27 DIAGNOSIS — M25512 Pain in left shoulder: Secondary | ICD-10-CM

## 2017-11-27 NOTE — Therapy (Signed)
[facility] [place] [address]  [address] [place], [place], [address] Phone: [phone]   Fax:  [phone]  Physical Therapy Treatment  Patient Details  Name: Martin [name] MRN: [id number] Date of Birth: 07-Feb-1951 Referring Provider: Dr. Norton BlizzardShane Hudnall   Encounter Date: 11/27/2017  PT End of Session - 11/27/17 1359    Visit Number  17    Number of Visits  20    Date for PT Re-Evaluation  12/16/17    Authorization Type  Medicare    PT Start Time  1355    PT Stop Time  1443    PT Time Calculation (min)  48 min    Activity Tolerance  Patient tolerated treatment well    Behavior During Therapy  [facility] for tasks assessed/performed       History reviewed. No pertinent past medical history.  Past Surgical History:  Procedure Laterality Date  . CATARACT EXTRACTION      There were no vitals filed for this visit.  Subjective Assessment - 11/27/17 1359    Subjective  doing well today - feels like his shoulder is gradually better    Diagnostic tests  xray - normal healing    Patient Stated Goals  improve function of shoulder/arm    Currently in Pain?  No/denies    Pain Score  0-No pain                      [facility] Adult PT Treatment/Exercise - 11/27/17 0001      Shoulder Exercises: Prone   Extension  Strengthening;Both;15 reps;Weights    Extension Weight (lbs)  2    Extension Limitations  "I" prone on p-ball     Horizontal ABduction 1  Strengthening;Both;15 reps;Weights    Horizontal ABduction 1 Weight (lbs)  1    Horizontal ABduction 1 Limitations  "T" prone on p-ball       Shoulder Exercises: ROM/Strengthening   UBE (Upper Arm Bike)  L3 x 6 min (3/3)    Cybex Row Limitations  L single arm - 1/2 kneeling cable column - 5# x 15 reps    Ball on Wall  flexion and abduction ball circles x 1-2 min each    Other ROM/Strengthening Exercises  upright row - cable column - 10# x 15 reps B UE    Other  ROM/Strengthening Exercises  lat pull down - B UE - 15# x 15 reps      Shoulder Exercises: Stretch   Other Shoulder Stretches  childs pose - 3 x 30 seconds      Manual Therapy   Manual Therapy  Joint mobilization;Passive ROM    Manual therapy comments  patient supine    Joint Mobilization  Grade [name]-IV inferior mobs with good tolerance    Passive ROM  PROM L GH joint all planes - over pressure at end range with pain limiting               PT Short Term Goals - 11/04/17 1529      PT SHORT TERM GOAL #1   Title  patient to be independent with initial HEP    Status  Achieved      PT SHORT TERM GOAL #2   Status  On-going        PT Long Term Goals - 11/04/17 1529      PT LONG TERM GOAL #1   Title  patient to be independent with advanced HEP  Status  On-going      PT LONG TERM GOAL #2   Title  patient to improve L shoulder AROM: flexion and abduction to >/= 150 degrees     Status  On-going      PT LONG TERM GOAL #3   Title  patient to improve L shoulder strength to >/= 4/5    Status  On-going      PT LONG TERM GOAL #4   Title  patient to report ability to perform ADLs and light household tasks without limitation by pain or ROM    Status  On-going            Plan - 11/27/17 1401    Clinical Impression Statement  Patient with seemingly with progressed ROM at L shoulder, however does take time to relax to gain motion. Making progress with strengthening with less noticable compensations at L shoulder as patient initially heavily incorporated UT to produce movement. WIll continue to progress functional mobility of L UE.     PT Treatment/Interventions  ADLs/Self Care Home Management;Cryotherapy;Electrical Stimulation;Iontophoresis 4mg /ml Dexamethasone;Moist Heat;Ultrasound;Therapeutic exercise;Therapeutic activities;Patient/family education;Manual techniques;Passive range of motion;Vasopneumatic Device;Taping;Dry needling    Consulted and Agree with Plan of Care   Patient       Patient will benefit from skilled therapeutic intervention in order to improve the following deficits and impairments:  Impaired UE functional use, Pain, Decreased strength, Decreased range of motion  Visit Diagnosis: Acute pain of left shoulder  Stiffness of left shoulder, not elsewhere classified  Other symptoms and signs involving the musculoskeletal system  Abnormal posture     Problem List Patient Active Problem List   Diagnosis Date Noted  . Closed fracture of proximal end of left humerus with routine healing, subsequent encounter 09/02/2017  . Nuclear sclerotic cataract of right eye 02/02/2017     Kipp LaurenceStephanie R Olga Seyler, PT, DPT 11/27/17 4:45 PM   [facility] [place] [address]  [address] [place], [place], [address] Phone: [phone]   Fax:  [phone]  Name: Martin [name] MRN: [id number] Date of Birth: Apr 18, 1951

## 2017-12-04 ENCOUNTER — Encounter: Payer: Self-pay | Admitting: Physical Therapy

## 2017-12-04 ENCOUNTER — Ambulatory Visit: Payer: Medicare Other | Admitting: Physical Therapy

## 2017-12-04 DIAGNOSIS — R29898 Other symptoms and signs involving the musculoskeletal system: Secondary | ICD-10-CM

## 2017-12-04 DIAGNOSIS — R293 Abnormal posture: Secondary | ICD-10-CM

## 2017-12-04 DIAGNOSIS — M25512 Pain in left shoulder: Secondary | ICD-10-CM

## 2017-12-04 DIAGNOSIS — M25612 Stiffness of left shoulder, not elsewhere classified: Secondary | ICD-10-CM | POA: Diagnosis not present

## 2017-12-04 NOTE — Therapy (Signed)
[facility] [address]  [address] [place], [place], [address] Phone: [phone]   Fax:  [phone]  Physical Therapy Treatment  Patient Details  Name: Martin [name] MRN: [id number] Date of Birth: 02-29-1951 Referring Provider: Dr. Norton BlizzardShane Hudnall   Encounter Date: 12/04/2017  PT End of Session - 12/04/17 1311    Visit Number  18    Number of Visits  20    Date for PT Re-Evaluation  12/16/17    Authorization Type  Medicare    PT Start Time  1309    PT Stop Time  1352    PT Time Calculation (min)  43 min    Activity Tolerance  Patient tolerated treatment well    Behavior During Therapy  [facility] for tasks assessed/performed       History reviewed. No pertinent past medical history.  Past Surgical History:  Procedure Laterality Date  . CATARACT EXTRACTION      There were no vitals filed for this visit.  Subjective Assessment - 12/04/17 1311    Subjective  doing well - no new complaints    Diagnostic tests  xray - normal healing    Patient Stated Goals  improve function of shoulder/arm    Currently in Pain?  No/denies    Pain Score  0-No pain                      [facility] Adult PT Treatment/Exercise - 12/04/17 0001      Shoulder Exercises: Seated   Other Seated Exercises  PT move L arm passively to end range, patient active hold at end range, eccentric lower; 10 reps flexion, 10 reps abduction      Shoulder Exercises: Standing   Flexion  Strengthening;Left;15 reps;Weights cabinet reaches    Shoulder Flexion Weight (lbs)  3    ABduction  Strengthening;Left;15 reps;Weights cabinet reaches    Shoulder ABduction Weight (lbs)  3    Row  Both;15 reps TRX      Shoulder Exercises: ROM/Strengthening   UBE (Upper Arm Bike)  L 3.5 x 6 min (3/3)    Wall Pushups  15 reps orange pball on wall    Other ROM/Strengthening Exercises  lat pull down - B UE - 25# x 20 reps      Shoulder Exercises: Stretch   Other Shoulder Stretches  childs pose - 3 x 30 seconds               PT Short Term Goals - 11/04/17 1529      PT SHORT TERM GOAL #1   Title  patient to be independent with initial HEP    Status  Achieved      PT SHORT TERM GOAL #2   Status  On-going        PT Long Term Goals - 11/04/17 1529      PT LONG TERM GOAL #1   Title  patient to be independent with advanced HEP    Status  On-going      PT LONG TERM GOAL #2   Title  patient to improve L shoulder AROM: flexion and abduction to >/= 150 degrees     Status  On-going      PT LONG TERM GOAL #3   Title  patient to improve L shoulder strength to >/= 4/5    Status  On-going      PT LONG TERM GOAL #4   Title  patient  to report ability to perform ADLs and light household tasks without limitation by pain or ROM    Status  On-going            Plan - 12/04/17 1312    Clinical Impression Statement  [name] StableBill doing well today - great improvements in strength and AROM with good tolerance to all activities. Patient does have continued slight shoulder compensations, however hopeful will contnue to improve with continued strengthening and active motion.    PT Treatment/Interventions  ADLs/Self Care Home Management;Cryotherapy;Electrical Stimulation;Iontophoresis 4mg /ml Dexamethasone;Moist Heat;Ultrasound;Therapeutic exercise;Therapeutic activities;Patient/family education;Manual techniques;Passive range of motion;Vasopneumatic Device;Taping;Dry needling    Consulted and Agree with Plan of Care  Patient       Patient will benefit from skilled therapeutic intervention in order to improve the following deficits and impairments:  Impaired UE functional use, Pain, Decreased strength, Decreased range of motion  Visit Diagnosis: Acute pain of left shoulder  Stiffness of left shoulder, not elsewhere classified  Other symptoms and signs involving the musculoskeletal system  Abnormal posture     Problem List Patient Active  Problem List   Diagnosis Date Noted  . Closed fracture of proximal end of left humerus with routine healing, subsequent encounter 09/02/2017  . Nuclear sclerotic cataract of right eye 02/02/2017     Kipp LaurenceStephanie R Mistey Hoffert, PT, DPT 12/04/17 1:53 PM   [facility] [place] [address] [place] [address]  [address] [place], [place], [address] Phone: [phone]   Fax:  [phone]  Name: Martin [name] MRN: [id number] Date of Birth: 1950/12/13

## 2017-12-08 ENCOUNTER — Encounter: Payer: Self-pay | Admitting: Physical Therapy

## 2017-12-08 ENCOUNTER — Ambulatory Visit: Payer: Medicare Other | Admitting: Physical Therapy

## 2017-12-08 DIAGNOSIS — R293 Abnormal posture: Secondary | ICD-10-CM

## 2017-12-08 DIAGNOSIS — M25612 Stiffness of left shoulder, not elsewhere classified: Secondary | ICD-10-CM | POA: Diagnosis not present

## 2017-12-08 DIAGNOSIS — R29898 Other symptoms and signs involving the musculoskeletal system: Secondary | ICD-10-CM | POA: Diagnosis not present

## 2017-12-08 DIAGNOSIS — M25512 Pain in left shoulder: Secondary | ICD-10-CM

## 2017-12-08 NOTE — Therapy (Addendum)
[facility] [place] [address]  [place], [place], [address] Phone: [phone]   Fax:  [phone]  Physical Therapy Treatment  Patient Details  Name: Martin [name] MRN: [id number] Date of Birth: 11/06/1951 Referring Provider: Dr. Karlton Lemon   Encounter Date: 12/08/2017  PT End of Session - 12/08/17 1354    Visit Number  19    Number of Visits  20    Date for PT Re-Evaluation  12/16/17    Authorization Type  Medicare    PT Start Time  2482    PT Stop Time  1435    PT Time Calculation (min)  42 min    Activity Tolerance  Patient tolerated treatment well    Behavior During Therapy  [facility] for tasks assessed/performed       History reviewed. No pertinent past medical history.  Past Surgical History:  Procedure Laterality Date  . CATARACT EXTRACTION      There were no vitals filed for this visit.  Subjective Assessment - 12/08/17 1354    Subjective  doing well - sees Dr. Barbaraann Barthel this week    Diagnostic tests  xray - normal healing    Patient Stated Goals  improve function of shoulder/arm    Currently in Pain?  No/denies    Pain Score  0-No pain         [facility] PT Assessment - 12/08/17 0001      AROM   AROM Assessment Site  Shoulder    Right/Left Shoulder  Left    Left Shoulder Flexion  147 Degrees    Left Shoulder ABduction  130 Degrees slight scaption    Left Shoulder Internal Rotation  -- FIR to ~T10-12    Left Shoulder External Rotation  -- FER to ~C6      PROM   PROM Assessment Site  Shoulder    Right/Left Shoulder  Left    Left Shoulder Flexion  150 Degrees    Left Shoulder ABduction  150 Degrees    Left Shoulder Internal Rotation  71 Degrees    Left Shoulder External Rotation  76 Degrees                  [facility] Adult PT Treatment/Exercise - 12/08/17 1356      Shoulder Exercises: Standing   External Rotation  Strengthening;Left;15 reps;Theraband    Theraband Level (Shoulder  External Rotation)  Level 2 (Red)    Internal Rotation  Strengthening;Left;15 reps;Theraband    Theraband Level (Shoulder Internal Rotation)  Level 2 (Red)    Row  Both;15 reps TRX      Shoulder Exercises: ROM/Strengthening   UBE (Upper Arm Bike)  L3 x 6 min (3/3)    Wall Pushups  15 reps orange pball at wall    Other ROM/Strengthening Exercises  upright row - cable column - 10# x 15 reps B UE    Other ROM/Strengthening Exercises  lat pull down - 25# x 15 reps - stretch hold at top      Manual Therapy   Manual Therapy  Passive ROM    Manual therapy comments  patient supine    Passive ROM  PROM L GH joint all planes - over pressure at end range with pain limiting               PT Short Term Goals - 12/08/17 1355      PT SHORT TERM GOAL #1   Title  patient  to be independent with initial HEP    Status  Achieved      PT SHORT TERM GOAL #2   Title  Patient to improve PROM of L shoulder flexion and abduction to >/= 150 degrees with pain no greater than 2/10    Status  Achieved        PT Long Term Goals - 12/08/17 1355      PT LONG TERM GOAL #1   Title  patient to be independent with advanced HEP    Status  Achieved      PT LONG TERM GOAL #2   Title  patient to improve L shoulder AROM: flexion and abduction to >/= 150 degrees     Status  Achieved      PT LONG TERM GOAL #3   Title  patient to improve L shoulder strength to >/= 4/5    Status  Achieved      PT LONG TERM GOAL #4   Title  patient to report ability to perform ADLs and light household tasks without limitation by pain or ROM    Status  Partially Met            Plan - 12/08/17 1355    Clinical Impression Statement  Mr. Lamorte has progressed wonderfully with PT. Patient with much improved AROM and strength today as compared to initial visit. Some compensations continue at L shoulder with AROM into overhead positions, but continue to imporve at each visit. Patient reports good compliance with HEP along  with starting to utilize L UE with more daily activities with ijmproved tolerance. Does report low confidence with walking down stairs and is vary cautious of surroundings since fall with injury. Patient to follow-up with MD this week with PT to await advice for continuation of treatment vs transition to HEP. Most goals met other than full utilization of L UE with all daily tasks.     PT Treatment/Interventions  ADLs/Self Care Home Management;Cryotherapy;Electrical Stimulation;Iontophoresis 49m/ml Dexamethasone;Moist Heat;Ultrasound;Therapeutic exercise;Therapeutic activities;Patient/family education;Manual techniques;Passive range of motion;Vasopneumatic Device;Taping;Dry needling    Consulted and Agree with Plan of Care  Patient       Patient will benefit from skilled therapeutic intervention in order to improve the following deficits and impairments:  Impaired UE functional use, Pain, Decreased strength, Decreased range of motion  Visit Diagnosis: Acute pain of left shoulder  Stiffness of left shoulder, not elsewhere classified  Other symptoms and signs involving the musculoskeletal system  Abnormal posture     G-Codes    Functional Assessment Tool Used (Outpatient Only)  FOTO: 72   Functional Limitation  Carrying, moving and handling objects    Carrying, Moving and Handling Objects Goal Status (([id number]  At least 20 percent but less than 40 percent impaired, limited or restricted    Carrying, Moving and Handling Objects Discharge Status [phone]  At least 20 percent but less than 40 percent impaired, limited or restricted      Problem List Patient Active Problem List   Diagnosis Date Noted  . Closed fracture of proximal end of left humerus with routine healing, subsequent encounter 09/02/2017  . Nuclear sclerotic cataract of right eye 02/02/2017     SLanney Gins PT, DPT 12/08/17 3:22 PM  PHYSICAL THERAPY DISCHARGE SUMMARY  Visits from Start of Care: 19  Current  functional level related to goals / functional outcomes: See above   Remaining deficits: See above   Education / Equipment: HEP  Plan: Patient agrees to discharge.  Patient  goals were partially met. Patient is being discharged due to being pleased with the current functional level.  ?????     Lanney Gins, PT, DPT 01/01/18 1:29 PM  [facility] [place] [address]  [place], [place], [address] Phone: [phone]   Fax:  [phone]  Name: Martin [name] MRN: [id number] Date of Birth: 07/25/1951

## 2017-12-10 ENCOUNTER — Ambulatory Visit (INDEPENDENT_AMBULATORY_CARE_PROVIDER_SITE_OTHER): Payer: Medicare Other | Admitting: Family Medicine

## 2017-12-10 ENCOUNTER — Encounter: Payer: Self-pay | Admitting: Family Medicine

## 2017-12-10 DIAGNOSIS — S42202D Unspecified fracture of upper end of left humerus, subsequent encounter for fracture with routine healing: Secondary | ICD-10-CM

## 2017-12-10 NOTE — Progress Notes (Signed)
PCP: Patient, No Pcp Per  Subjective:   HPI: Patient is a 67 y.o. male here for left arm injury.  9/21: Patient reports on 9/1 he tripped and fell directly onto left arm. Immediate pain, difficulty moving about the shoulder on the left. Pain has improved since then and is 0/10 at rest, gets sharp if trying to move the arm though. Using a sling regularly. Is right handed. + swelling and bruising.  10/12: Patient reports he's doing well. Rarely if turning the wrong way will feel a 7/10 sharp pain left shoulder but it doesn't linger. Not taking any medicines. No night pain. Doing some home exercises. Still with some bruising laterally.  No other skin changes. No numbness.  10/24/17: Patient reports he's doing very well. Pain is 0/10 level even with home exercises. Motion has been improving slowly. Doing well in physical therapy and really appreciates their care, would like to continue. No swelling. No skin changes, numbness.  12/10/17: Patient reports he's doing much better. Motion is improving, strength is good. Pain is 0/10 - only mild soreness with full extent of motion. No skin changes, numbness.  History reviewed. No pertinent past medical history.  Current Outpatient Medications on File Prior to Visit  Medication Sig Dispense Refill  . aspirin 81 MG chewable tablet Chew by mouth.    . latanoprost (XALATAN) 0.005 % ophthalmic solution     . meloxicam (MOBIC) 15 MG tablet Take 1 tablet (15 mg total) by mouth daily. 30 tablet 1  . timolol (TIMOPTIC) 0.5 % ophthalmic solution     . traMADol (ULTRAM) 50 MG tablet Take 1 tablet (50 mg total) by mouth every 6 (six) hours as needed. 15 tablet 0   No current facility-administered medications on file prior to visit.     Past Surgical History:  Procedure Laterality Date  . CATARACT EXTRACTION      No Known Allergies  Social History   Socioeconomic History  . Marital status: Single    Spouse name: Not on file  .  Number of children: Not on file  . Years of education: Not on file  . Highest education level: Not on file  Social Needs  . Financial resource strain: Not on file  . Food insecurity - worry: Not on file  . Food insecurity - inability: Not on file  . Transportation needs - medical: Not on file  . Transportation needs - non-medical: Not on file  Occupational History  . Not on file  Tobacco Use  . Smoking status: Current Every Day Smoker    Packs/day: 0.50  . Smokeless tobacco: Never Used  Substance and Sexual Activity  . Alcohol use: Yes    Comment: occ  . Drug use: No  . Sexual activity: Not on file  Other Topics Concern  . Not on file  Social History Narrative  . Not on file    History reviewed. No pertinent family history.  BP (!) 160/110   Pulse 84   Ht 5\' 10"  (1.778 m)   Wt 156 lb (70.8 kg)   BMI 22.38 kg/m   Review of Systems: See HPI above.     Objective:  Physical Exam:  Gen: NAD, comfortable in exam room.  Left shoulder: No swelling, ecchymoses.  No gross deformity. No TTP. ROM limited to 80 degrees ER, 120 flexion, 120 abduction. Negative Hawkins, Neers. Negative Yergasons. Strength 5/5 with empty can and resisted internal/external rotation. NV intact distally.  Assessment & Plan:  1. Left proximal  humerus fracture, closed - Fracture healed.  Clinically improving - good strength but motion still lags.  He will focus on motion exercises.  Discussed can consider intraarticular injection trial if still not improving - possibility of adhesive capsulitis limiting motion in addition to sequelae of his fracture.  Tylenol if needed.

## 2017-12-10 NOTE — Patient Instructions (Signed)
You can have therapy put you on hold now. Continue the home exercises - focus on the motion ones we discussed and do these every day.  Do strengthening at least 2-3 times a week. Follow up with me as needed. We can consider trial of intraarticular injection in several months if your motion doesn't improve (it's possible you never fully regain the motion though).

## 2017-12-11 ENCOUNTER — Encounter: Payer: Self-pay | Admitting: Family Medicine

## 2017-12-11 NOTE — Assessment & Plan Note (Signed)
Fracture healed.  Clinically improving - good strength but motion still lags.  He will focus on motion exercises.  Discussed can consider intraarticular injection trial if still not improving - possibility of adhesive capsulitis limiting motion in addition to sequelae of his fracture.  Tylenol if needed.

## 2017-12-18 DIAGNOSIS — Z961 Presence of intraocular lens: Secondary | ICD-10-CM | POA: Diagnosis not present

## 2017-12-18 DIAGNOSIS — H2512 Age-related nuclear cataract, left eye: Secondary | ICD-10-CM | POA: Diagnosis not present

## 2017-12-18 DIAGNOSIS — H25042 Posterior subcapsular polar age-related cataract, left eye: Secondary | ICD-10-CM | POA: Diagnosis not present

## 2017-12-18 DIAGNOSIS — H25012 Cortical age-related cataract, left eye: Secondary | ICD-10-CM | POA: Diagnosis not present

## 2018-01-07 DIAGNOSIS — F17210 Nicotine dependence, cigarettes, uncomplicated: Secondary | ICD-10-CM | POA: Diagnosis not present

## 2018-01-07 DIAGNOSIS — I10 Essential (primary) hypertension: Secondary | ICD-10-CM | POA: Diagnosis not present

## 2018-01-07 DIAGNOSIS — H2512 Age-related nuclear cataract, left eye: Secondary | ICD-10-CM | POA: Diagnosis not present

## 2018-01-07 DIAGNOSIS — Z79899 Other long term (current) drug therapy: Secondary | ICD-10-CM | POA: Diagnosis not present

## 2018-01-07 DIAGNOSIS — Z7982 Long term (current) use of aspirin: Secondary | ICD-10-CM | POA: Diagnosis not present

## 2018-08-24 IMAGING — DX DG HUMERUS 2V *L*
2 series · 2 of 2 positions shown · non-contrast
Comparison: August 11, 2017

CLINICAL DATA: Recent fracture of the proximal humerus

EXAM:
LEFT HUMERUS - 2+ VIEW

[humerus ap]
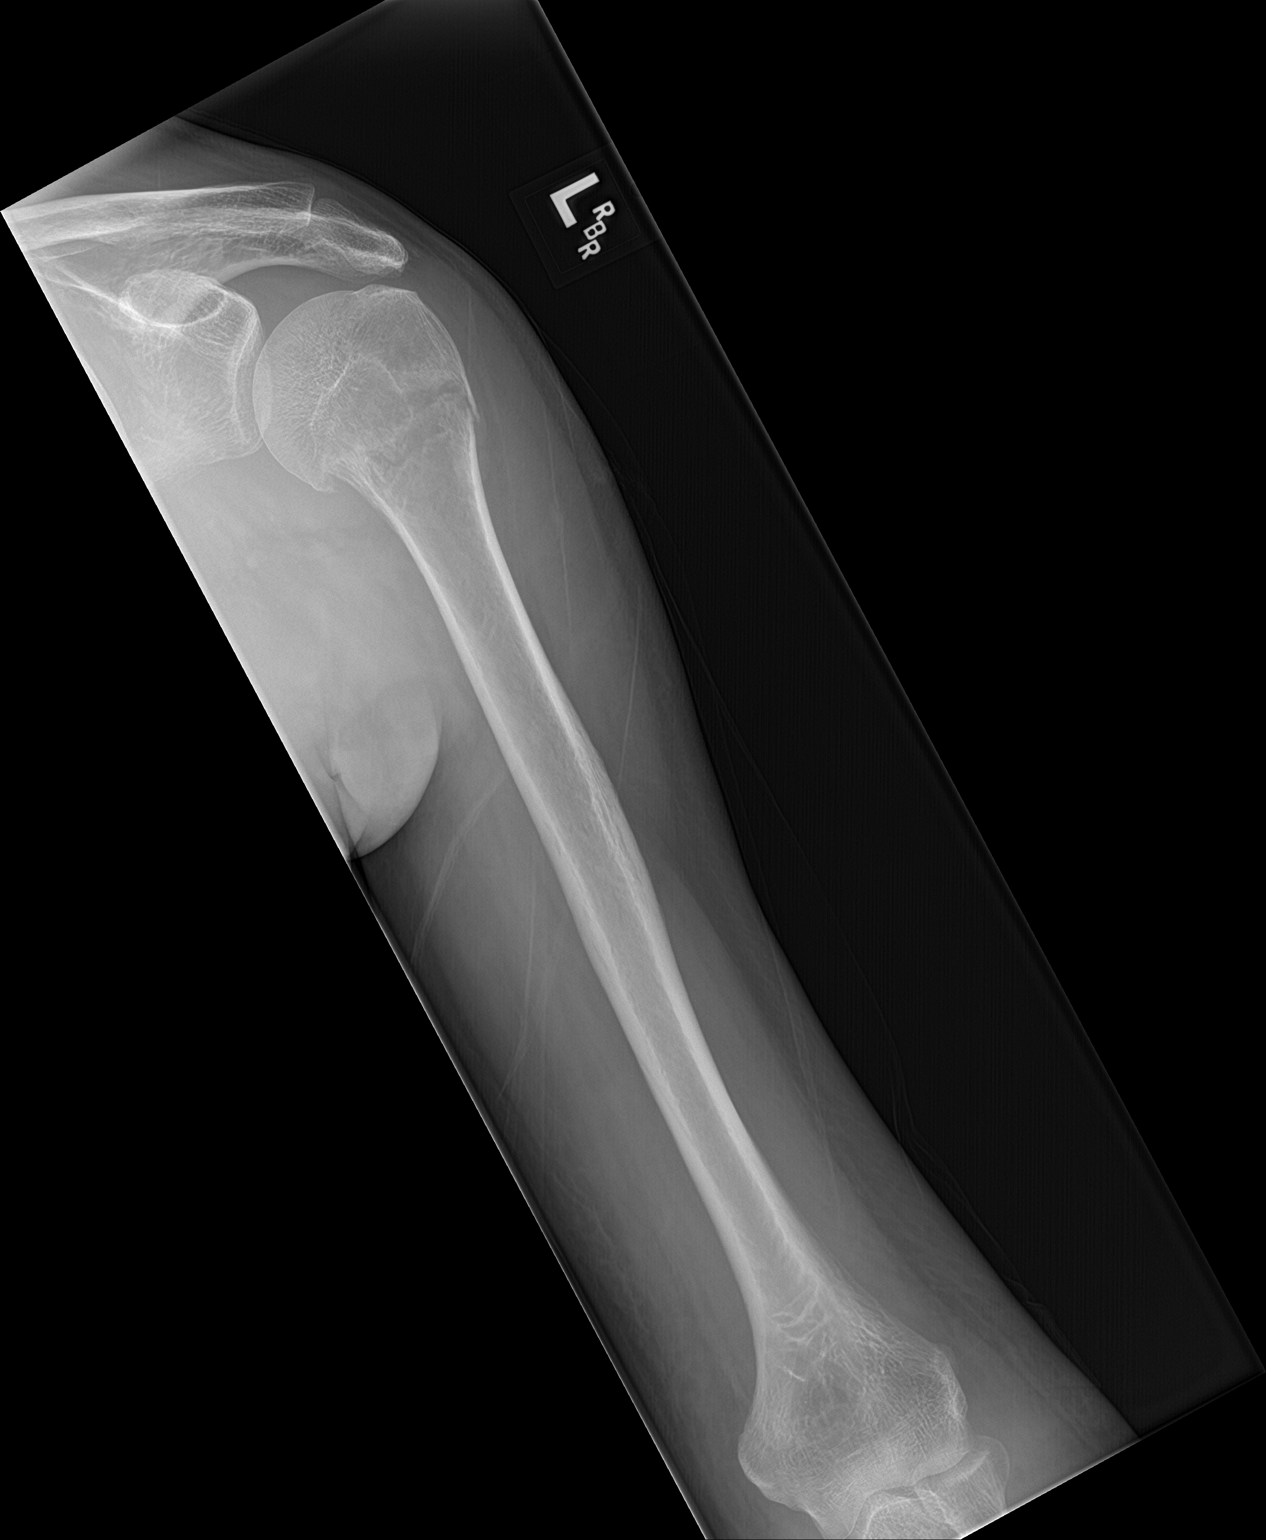

[humerus lat]
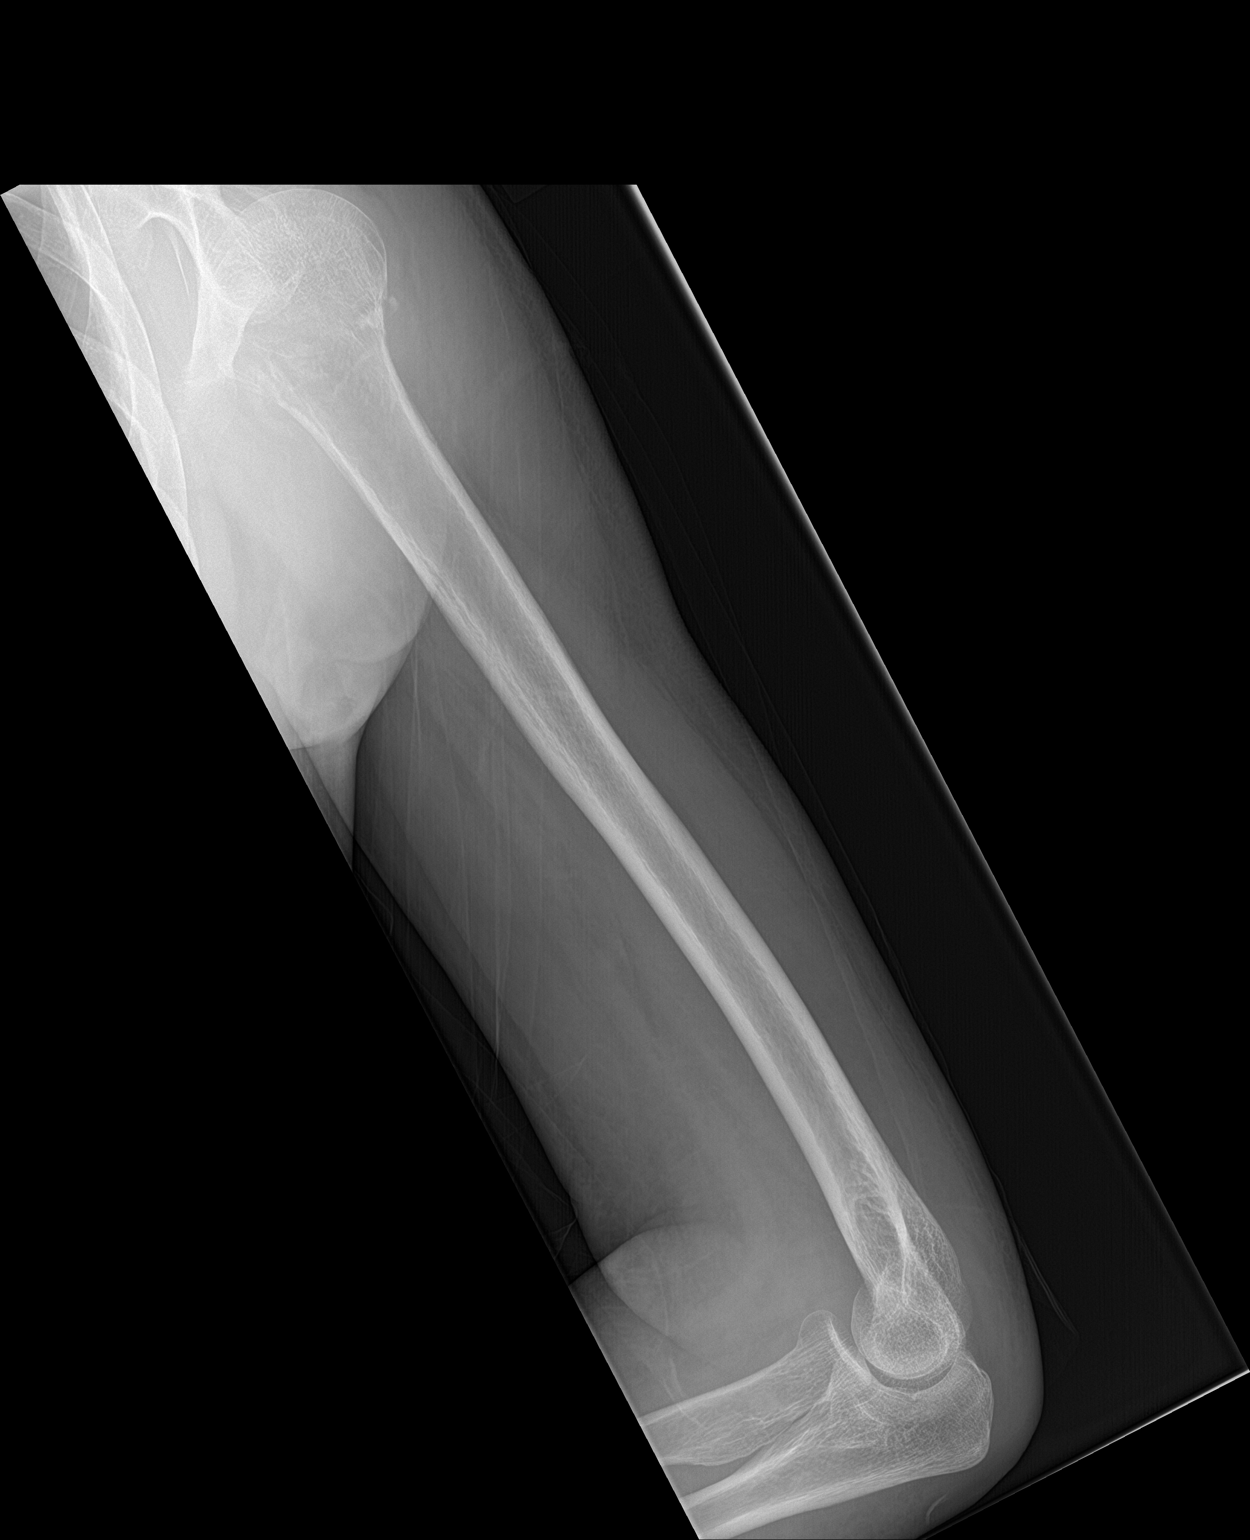

[2 of 2 positions shown; findings below may reference images not displayed]

FINDINGS: Frontal and lateral views obtained. The transversely oriented
fracture of the proximal humeral metaphysis is again noted with
overall alignment near anatomic. There is slight impaction fracture
site. A tiny bony fragment is noted just lateral to the fracture,
not seen on recent study. There is no appreciable callus formation.

No new fracture evident. No dislocation. No appreciable joint space
narrowing or erosion.
IMPRESSION: No appreciable callus is seen at the site of the proximal humeral
fracture since recent study. Alignment remains near anatomic with
slight impaction at the fracture site. The small bony fragment
lateral to the fracture site was not appreciable on recent prior
study.

No new fracture.  No dislocation.  No evident arthropathy.

## 2018-09-14 IMAGING — DX DG HUMERUS 2V *L*
2 series · 2 of 2 positions shown · non-contrast
Comparison: Radiographs August 29, 2017.

CLINICAL DATA: Follow-up left proximal humeral fracture.

EXAM:
LEFT HUMERUS - 2+ VIEW

[humerus ap]
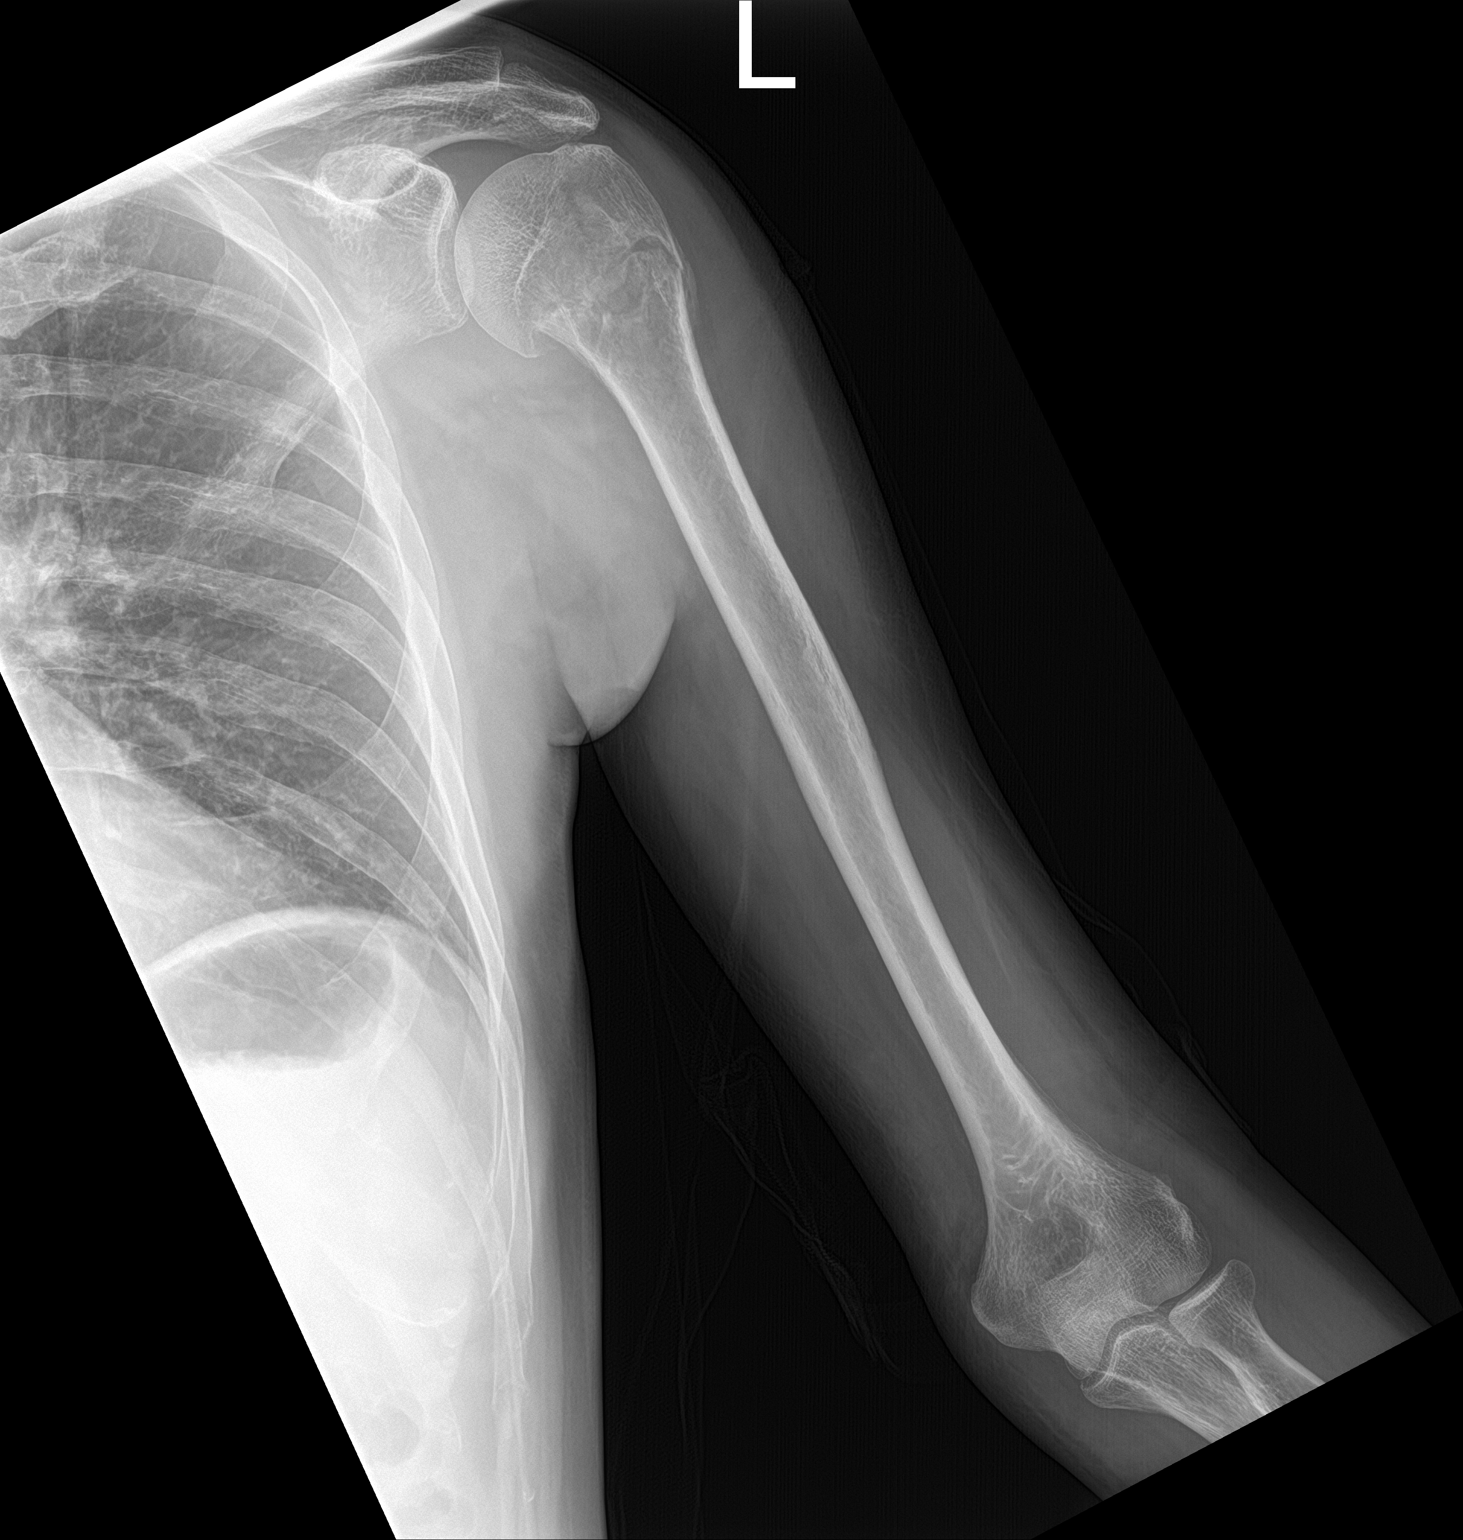

[humerus lat]
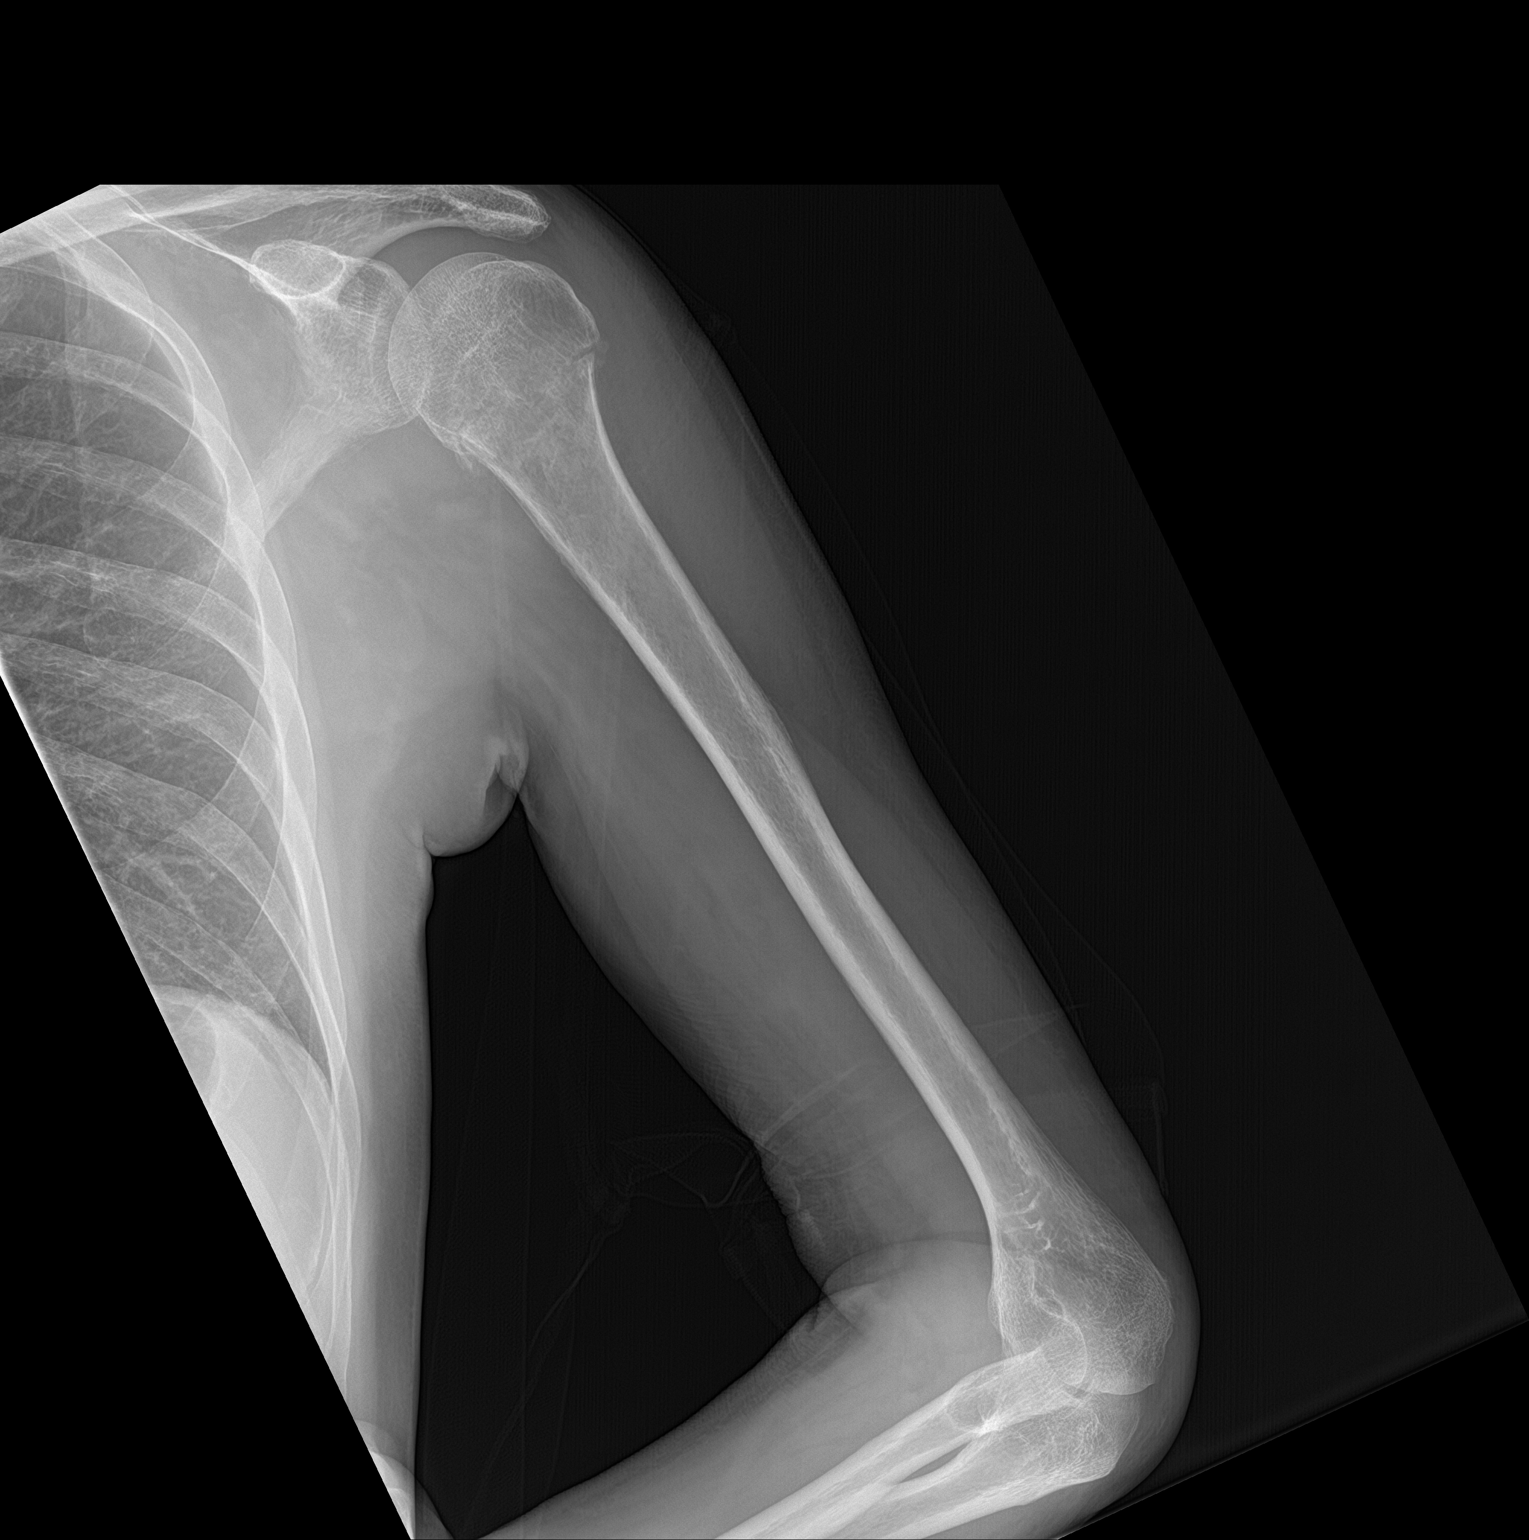

[2 of 2 positions shown; findings below may reference images not displayed]

FINDINGS: Stable appearance of minimally displaced proximal left humeral neck
fracture compared to prior exam, with stable minimal callus
formation seen. No new fractures are noted. Soft tissue abnormality
is noted. Persistent fracture line remains.
IMPRESSION: Stable appearance of minimally displaced proximal left humeral neck
fracture with stable minimal callus formation.
